# Patient Record
Sex: Male | Born: 1940 | Race: White | Hispanic: No | Marital: Married | Smoking: Never smoker
Health system: Southern US, Community
[De-identification: ages and names within clinical notes are randomized; demographics above are authoritative.]

## PROBLEM LIST (undated history)

## (undated) DIAGNOSIS — L409 Psoriasis, unspecified: Secondary | ICD-10-CM

## (undated) HISTORY — PX: SHOULDER ARTHROSCOPY: SHX128

## (undated) HISTORY — PX: LUMBAR LAMINECTOMY: SHX95

## (undated) HISTORY — PX: HIP ARTHROPLASTY: SHX981

## (undated) HISTORY — PX: ANKLE ARTHROSCOPY: SHX545

## (undated) HISTORY — PX: CATARACT EXTRACTION: SUR2

## (undated) HISTORY — DX: Psoriasis, unspecified: L40.9

---

## 2014-04-30 ENCOUNTER — Encounter: Payer: Self-pay | Admitting: Sports Medicine

## 2014-04-30 ENCOUNTER — Ambulatory Visit (INDEPENDENT_AMBULATORY_CARE_PROVIDER_SITE_OTHER): Payer: Medicare Other | Admitting: Sports Medicine

## 2014-04-30 VITALS — BP 141/75 | HR 60 | Wt 160.0 lb

## 2014-04-30 DIAGNOSIS — M1611 Unilateral primary osteoarthritis, right hip: Secondary | ICD-10-CM | POA: Insufficient documentation

## 2014-04-30 DIAGNOSIS — M1610 Unilateral primary osteoarthritis, unspecified hip: Secondary | ICD-10-CM

## 2014-04-30 DIAGNOSIS — M169 Osteoarthritis of hip, unspecified: Secondary | ICD-10-CM

## 2014-04-30 MED ORDER — TRAMADOL HCL 50 MG PO TABS: ORAL_TABLET | ORAL | Status: DC

## 2014-04-30 NOTE — Assessment & Plan Note (Signed)
Persistent despite NSAIDs. Tramadol, home rehabilitation, femoral acetabular joint injection as above. Return to see me in a month.

## 2014-04-30 NOTE — Progress Notes (Signed)
   Subjective:    I'm seeing this patient as a consultation for:  Dr. Gae Dry  CC: Right hip pain  HPI: This is a pleasant 73 year old male, for several days and increasing pain he localizes in his right groin, moderate, persistent, worse with weightbearing. No trauma, no constitutional symptoms by his PCP where x-ray showed hip osteoarthritis and he was given an NSAID. Unfortunately this has been ineffective and he has persistent pain.  Past medical history, Surgical history, Family history not pertinant except as noted below, Social history, Allergies, and medications have been entered into the medical record, reviewed, and no changes needed.   Review of Systems: No headache, visual changes, nausea, vomiting, diarrhea, constipation, dizziness, abdominal pain, skin rash, fevers, chills, night sweats, weight loss, swollen lymph nodes, body aches, joint swelling, muscle aches, chest pain, shortness of breath, mood changes, visual or auditory hallucinations.   Objective:   General: Well Developed, well nourished, and in no acute distress.  Neuro/Psych: Alert and oriented x3, extra-ocular muscles intact, able to move all 4 extremities, sensation grossly intact. Skin: Warm and dry, no rashes noted.  Respiratory: Not using accessory muscles, speaking in full sentences, trachea midline.  Cardiovascular: Pulses palpable, no extremity edema. Abdomen: Does not appear distended. Right Hip: ROM IR: 45 Deg, ER: 45 Deg, Flexion: 120 Deg, Extension: 100 Deg, Abduction: 45 Deg, Adduction: 45 Deg Strength IR: 5/5, ER: 5/5, Flexion: 5/5, Extension: 5/5, Abduction: 5/5, Adduction: 5/5 There is good internal rotation however this does reproduce pain Pelvic alignment unremarkable to inspection and palpation. Standing hip rotation and gait without trendelenburg sign / unsteadiness. Greater trochanter without tenderness to palpation. No tenderness over piriformis. No pain with FABER or FADIR. No SI joint  tenderness and normal minimal SI movement.  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  Procedure: Real-time Ultrasound Guided Injection of right hip Device: GE Logiq E  Verbal informed consent obtained.  Time-out conducted.  Noted no overlying erythema, induration, or other signs of local infection.  Skin prepped in a sterile fashion.  Local anesthesia: Topical Ethyl chloride.  With sterile technique and under real time ultrasound guidance:  Spinal needle advanced to the femoral head/neck junction, contacted bone, and a total of 2 cc Kenalog 40, 4 cc lidocaine injected easily. Completed without difficulty  Pain immediately resolved suggesting accurate placement of the medication.  Advised to call if fevers/chills, erythema, induration, drainage, or persistent bleeding.  Images permanently stored and available for review in the ultrasound unit.  Impression: Technically successful ultrasound guided injection.  Impression and Recommendations:   This case required medical decision making of moderate complexity.

## 2014-05-18 ENCOUNTER — Ambulatory Visit (INDEPENDENT_AMBULATORY_CARE_PROVIDER_SITE_OTHER): Payer: Medicare Other | Admitting: Sports Medicine

## 2014-05-18 ENCOUNTER — Ambulatory Visit (INDEPENDENT_AMBULATORY_CARE_PROVIDER_SITE_OTHER): Payer: Medicare Other

## 2014-05-18 ENCOUNTER — Encounter: Payer: Self-pay | Admitting: Sports Medicine

## 2014-05-18 VITALS — BP 131/76 | HR 67 | Ht 68.0 in | Wt 157.0 lb

## 2014-05-18 DIAGNOSIS — M5030 Other cervical disc degeneration, unspecified cervical region: Secondary | ICD-10-CM

## 2014-05-18 DIAGNOSIS — M542 Cervicalgia: Secondary | ICD-10-CM

## 2014-05-18 MED ORDER — PREDNISONE 50 MG PO TABS: ORAL_TABLET | ORAL | Status: DC

## 2014-05-18 MED ORDER — MELOXICAM 15 MG PO TABS: ORAL_TABLET | ORAL | Status: DC

## 2014-05-18 MED ORDER — KETOROLAC TROMETHAMINE 30 MG/ML IJ SOLN
30.0000 mg | Freq: Once | INTRAMUSCULAR | Status: AC
  Administered 2014-05-18: 30 mg via INTRAMUSCULAR

## 2014-05-18 MED ORDER — CYCLOBENZAPRINE HCL 10 MG PO TABS: ORAL_TABLET | ORAL | Status: DC

## 2014-05-18 NOTE — Progress Notes (Signed)
  Subjective:    CC: Neck pain  HPI: This pleasant 73 year old male comes in with a several week history of pain in his neck after painting the ceiling. It is moderate, persistent, worse with extension, no radiation no radicular signs, no lower extremity symptoms, no trauma, no constitutional symptoms.  Past medical history, Surgical history, Family history not pertinant except as noted below, Social history, Allergies, and medications have been entered into the medical record, reviewed, and no changes needed.   Review of Systems: No fevers, chills, night sweats, weight loss, chest pain, or shortness of breath.   Objective:    General: Well Developed, well nourished, and in no acute distress.  Neuro: Alert and oriented x3, extra-ocular muscles intact, sensation grossly intact.  HEENT: Normocephalic, atraumatic, pupils equal round reactive to light, neck supple, no masses, no lymphadenopathy, thyroid nonpalpable.  Skin: Warm and dry, no rashes. Cardiac: Regular rate and rhythm, no murmurs rubs or gallops, no lower extremity edema.  Respiratory: Clear to auscultation bilaterally. Not using accessory muscles, speaking in full sentences. Neck: Inspection unremarkable. No palpable stepoffs. Negative Spurling's maneuver. 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

## 2014-05-18 NOTE — Assessment & Plan Note (Signed)
Axial pain and spasm without radicular signs after painting the ceiling.  Toradol 30, Flexeril, prednisone, Mobic. Rehabilitation exercises, x-rays. Return in one month

## 2014-05-29 ENCOUNTER — Ambulatory Visit (INDEPENDENT_AMBULATORY_CARE_PROVIDER_SITE_OTHER): Payer: Medicare Other | Admitting: Sports Medicine

## 2014-05-29 ENCOUNTER — Encounter: Payer: Self-pay | Admitting: Sports Medicine

## 2014-05-29 VITALS — BP 131/72 | HR 73 | Ht 68.0 in | Wt 161.0 lb

## 2014-05-29 DIAGNOSIS — M542 Cervicalgia: Secondary | ICD-10-CM

## 2014-05-29 DIAGNOSIS — M1610 Unilateral primary osteoarthritis, unspecified hip: Secondary | ICD-10-CM

## 2014-05-29 DIAGNOSIS — M1611 Unilateral primary osteoarthritis, right hip: Secondary | ICD-10-CM

## 2014-05-29 NOTE — Assessment & Plan Note (Signed)
Completely resolved with conservative measures. 

## 2014-05-29 NOTE — Progress Notes (Signed)
  Subjective:    CC: Followup  HPI: Right hip osteoarthritis: Resolved after injection month ago.  Neck pain: Cervical degenerative disc disease on x-rays, resolved after conservative treatment. Occasional twinges of pain, but minimal and he can live with it.  Past medical history, Surgical history, Family history not pertinant except as noted below, Social history, Allergies, and medications have been entered into the medical record, reviewed, and no changes needed.   Review of Systems: No fevers, chills, night sweats, weight loss, chest pain, or shortness of breath.   Objective:    General: Well Developed, well nourished, and in no acute distress.  Neuro: Alert and oriented x3, extra-ocular muscles intact, sensation grossly intact.  HEENT: Normocephalic, atraumatic, pupils equal round reactive to light, neck supple, no masses, no lymphadenopathy, thyroid nonpalpable.  Skin: Warm and dry, no rashes. Cardiac: Regular rate and rhythm, no murmurs rubs or gallops, no lower extremity edema.  Respiratory: Clear to auscultation bilaterally. Not using accessory muscles, speaking in full sentences. Neck: Inspection unremarkable. No palpable stepoffs. Negative Spurling's maneuver. Full neck range of motion Grip strength and sensation normal in bilateral hands Strength good C4 to T1 distribution No sensory change to C4 to T1 Negative Hoffman sign bilaterally Reflexes normal  Impression and Recommendations:

## 2014-05-29 NOTE — Assessment & Plan Note (Signed)
Pain-free after injection one month ago.

## 2014-06-22 ENCOUNTER — Ambulatory Visit (INDEPENDENT_AMBULATORY_CARE_PROVIDER_SITE_OTHER): Payer: Medicare Other | Admitting: Sports Medicine

## 2014-06-22 ENCOUNTER — Encounter: Payer: Self-pay | Admitting: Sports Medicine

## 2014-06-22 VITALS — BP 142/77 | HR 72 | Ht 68.0 in | Wt 160.0 lb

## 2014-06-22 DIAGNOSIS — M1610 Unilateral primary osteoarthritis, unspecified hip: Secondary | ICD-10-CM

## 2014-06-22 DIAGNOSIS — M1611 Unilateral primary osteoarthritis, right hip: Secondary | ICD-10-CM

## 2014-06-22 MED ORDER — DEVILS CLAW ROOT POWD: Status: DC

## 2014-06-22 MED ORDER — GLUCOSAMINE-CHONDROITIN 750-600 MG PO CHEW: 2.0000 | CHEWABLE_TABLET | Freq: Two times a day (BID) | ORAL | Status: DC

## 2014-06-22 MED ORDER — CELECOXIB 200 MG PO CAPS: ORAL_CAPSULE | ORAL | Status: DC

## 2014-06-22 NOTE — Progress Notes (Signed)
  Procedure: Real-time Ultrasound Guided Injection of right femoral acetabular joint Device: GE Logiq E  Verbal informed consent obtained.  Time-out conducted.  Noted no overlying erythema, induration, or other signs of local infection.  Skin prepped in a sterile fashion.  Local anesthesia: Topical Ethyl chloride.  With sterile technique and under real time ultrasound guidance:  1 cc Depo-Medrol 40, 4 cc lidocaine injected easily into the femoral acetabular joint. Completed without difficulty  Pain immediately resolved suggesting accurate placement of the medication.  Advised to call if fevers/chills, erythema, induration, drainage, or persistent bleeding.  Images permanently stored and available for review in the ultrasound unit.  Impression: Technically successful ultrasound guided injection.

## 2014-06-22 NOTE — Assessment & Plan Note (Signed)
Repeat injection, this is only 2 months after the last injection. He is now a candidate for total hip arthroplasty. I'm going to add glucosamine, chondroitin as well as devils claw. Switching to Celebrex

## 2014-07-20 ENCOUNTER — Ambulatory Visit: Payer: Medicare Other | Admitting: Sports Medicine

## 2016-08-01 ENCOUNTER — Ambulatory Visit (INDEPENDENT_AMBULATORY_CARE_PROVIDER_SITE_OTHER): Payer: Medicare Other

## 2016-08-01 ENCOUNTER — Ambulatory Visit (INDEPENDENT_AMBULATORY_CARE_PROVIDER_SITE_OTHER): Payer: Medicare Other | Admitting: Sports Medicine

## 2016-08-01 DIAGNOSIS — M5136 Other intervertebral disc degeneration, lumbar region: Secondary | ICD-10-CM

## 2016-08-01 DIAGNOSIS — M48061 Spinal stenosis, lumbar region without neurogenic claudication: Secondary | ICD-10-CM | POA: Insufficient documentation

## 2016-08-01 DIAGNOSIS — M51369 Other intervertebral disc degeneration, lumbar region without mention of lumbar back pain or lower extremity pain: Secondary | ICD-10-CM

## 2016-08-01 MED ORDER — PREDNISONE 50 MG PO TABS: ORAL_TABLET | ORAL | 0 refills | Status: DC

## 2016-08-01 MED ORDER — CYCLOBENZAPRINE HCL 10 MG PO TABS: ORAL_TABLET | ORAL | 0 refills | Status: DC

## 2016-08-01 NOTE — Progress Notes (Signed)
  Subjective:    CC: Right hip pain  HPI: This is a pleasant 75 year old male with several weeks of pain that he localizes in the posterior aspect of his right hip with radiation to the back of the thigh, moderate, persistent, worse with sitting in a car, flexion, Valsalva without any overt radiculopathy. No bowel or bladder dysfunction, saddle numbness, constitutional symptoms, or trauma.  Past medical history, Surgical history, Family history not pertinant except as noted below, Social history, Allergies, and medications have been entered into the medical record, reviewed, and no changes needed.   Review of Systems: No fevers, chills, night sweats, weight loss, chest pain, or shortness of breath.   Objective:    General: Well Developed, well nourished, and in no acute distress.  Neuro: Alert and oriented x3, extra-ocular muscles intact, sensation grossly intact.  HEENT: Normocephalic, atraumatic, pupils equal round reactive to light, neck supple, no masses, no lymphadenopathy, thyroid nonpalpable.  Skin: Warm and dry, no rashes. Cardiac: Regular rate and rhythm, no murmurs rubs or gallops, no lower extremity edema.  Respiratory: Clear to auscultation bilaterally. Not using accessory muscles, speaking in full sentences. Right Hip: ROM IR: 60 Deg, ER: 60 Deg, Flexion: 120 Deg, Extension: 100 Deg, Abduction: 45 Deg, Adduction: 45 Deg Strength IR: 5/5, ER: 5/5, Flexion: 5/5, Extension: 5/5, Abduction: 5/5, Adduction: 5/5 Pelvic alignment unremarkable to inspection and palpation. Standing hip rotation and gait without trendelenburg / unsteadiness. Greater trochanter without tenderness to palpation. No tenderness over piriformis. No SI joint tenderness and normal minimal SI movement. Back Exam:  Inspection: Unremarkable  Motion: Flexion 45 deg, Extension 45 deg, Side Bending to 45 deg bilaterally,  Rotation to 45 deg bilaterally  SLR laying: Positive with reproduction of back pain but no  radiculopathy  XSLR laying: Negative  Palpable tenderness: None. FABER: negative. Sensory change: Gross sensation intact to all lumbar and sacral dermatomes.  Reflexes: 2+ at both patellar tendons, 2+ at achilles tendons, Babinski's downgoing.  Strength at foot  Plantar-flexion: 5/5 Dorsi-flexion: 5/5 Eversion: 5/5 Inversion: 5/5  Leg strength  Quad: 5/5 Hamstring: 5/5 Hip flexor: 5/5 Hip abductors: 5/5  Gait unremarkable.  Lumbar spine x-rays show widespread degenerative disc disease  Impression and Recommendations:    Lumbar degenerative disc disease Axial, mostly discogenic-type back pain.  Physical therapy, prednisone, Flexeril at bedtime. X-rays. Next line return to see me in one month, MRI for interventional planning if no better.

## 2016-08-01 NOTE — Assessment & Plan Note (Signed)
Axial, mostly discogenic-type back pain.  Physical therapy, prednisone, Flexeril at bedtime. X-rays. Next line return to see me in one month, MRI for interventional planning if no better.

## 2016-08-09 ENCOUNTER — Ambulatory Visit (INDEPENDENT_AMBULATORY_CARE_PROVIDER_SITE_OTHER): Payer: Medicare Other | Admitting: Physical Therapy

## 2016-08-09 ENCOUNTER — Encounter: Payer: Self-pay | Admitting: Physical Therapy

## 2016-08-09 DIAGNOSIS — M2560 Stiffness of unspecified joint, not elsewhere classified: Secondary | ICD-10-CM | POA: Diagnosis not present

## 2016-08-09 DIAGNOSIS — M5441 Lumbago with sciatica, right side: Secondary | ICD-10-CM | POA: Diagnosis not present

## 2016-08-09 DIAGNOSIS — M2569 Stiffness of other specified joint, not elsewhere classified: Secondary | ICD-10-CM

## 2016-08-09 DIAGNOSIS — M6281 Muscle weakness (generalized): Secondary | ICD-10-CM

## 2016-08-09 NOTE — Therapy (Signed)
[facility] [address] [place], [place], [address] Phone: [phone]   Fax:  [phone]  Physical Therapy Evaluation  Patient Details  Name: Randy Ortega MRN: [id number] Date of Birth: [birth year] Referring Provider: Dr Benjamin Stainhekkekandam  Encounter Date: 08/09/2016      PT End of Session - 08/09/16 0921    Visit Number 1   Number of Visits 6   Date for PT Re-Evaluation 09/20/16   PT Start Time 0846   PT Stop Time 0945   PT Time Calculation (min) 59 min   Activity Tolerance Patient limited by pain      Past Medical History:  Diagnosis Date  . Psoriasis     History reviewed. No pertinent surgical history.  There were no vitals filed for this visit.       Subjective Assessment - 08/09/16 0849    Subjective Pt reports he has a long h/o of low back pain, noticed it most with using a push mower, now using a riding one.  He would have episodes where the pain was worse, about 3 months ago he was having trouble lifting the Rt LE due to pain.  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    Pertinent History Rt shoulder surgery years ago.  Lt shoulder scope, bilat knee scopes and Rt ankle repair. 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 Has a home TENS unit   How long can you sit comfortably? sitting is fine, transitioning to stand difficult.  Has to limp a little then improves.    How long can you walk comfortably? no limits   Diagnostic tests x-rays DDD in lumbar spine.    Patient Stated Goals learn what he can do to keep getting around   Currently in Pain? Yes   Pain Score 3    Pain Location Back   Pain Orientation Right   Pain Descriptors / Indicators Dull   Pain Type Chronic pain   Pain Onset More than a month ago   Pain Frequency Constant   Aggravating Factors  prolonged sitting and overwork in the yard   Pain Relieving Factors walking,             [facility] - 08/09/16 0001      Assessment   Medical  Diagnosis Lumbar DDD   Referring Provider Dr Benjamin Stainhekkekandam   Onset Date/Surgical Date 05/09/16   Hand Dominance Right   Next MD Visit 08/31/16   Prior Therapy not for back     Precautions   Precautions None     Balance Screen   Has the patient fallen in the past 6 months No   Has the patient had a decrease in activity level because of a fear of falling?  No   Is the patient reluctant to leave their home because of a fear of falling?  No     Home Environment   Living Environment Private residence   Living Arrangements Spouse/significant other     Prior Function   Level of Independence Independent   Vocation Retired   Leisure yard work, outside [employer]ies, stays busy.      Observation/Other Assessments   Focus on Therapeutic Outcomes (FOTO)  45% limited     Functional Tests   Functional tests Squat;Single leg stance     Squat   Comments slight shift to Lt      Single Leg Stance   Comments Lt WNL, Rt has pain      Posture/Postural Control  Posture/Postural Control No significant limitations     ROM / Strength   AROM / PROM / Strength AROM;Strength     AROM   AROM Assessment Site Lumbar   Lumbar Flexion to floor with Rt side pressure   Lumbar Extension decreased 25%   Lumbar - Right Side Bend WNL with pain   Lumbar - Left Side Bend WNL   Lumbar - Right Rotation WNL   Lumbar - Left Rotation WNL     Strength   Strength Assessment Site Hip;Knee;Ankle;Lumbar   Right/Left Hip --  Lt WNL, Rt 5-/5 through out   Right/Left Knee Right  Lt WNL   Right Knee Flexion 4+/5   Right Knee Extension 4/5   Right/Left Ankle --  bilat WNL   Lumbar Flexion --  TA fair   Lumbar Extension --  multifidi good     Flexibility   Soft Tissue Assessment /Muscle Length yes   Hamstrings Lt 60, Rt 55  tight Rt SKTC   Quadriceps Lt 118, Rt 110 in prone     Palpation   Spinal mobility CPA mobs sacrum/lumbar WNL, pain and hypomobile at Rt UPA L5-3, Lt UPA WNL   Palpation comment some  tightness in lumbar paraspinals however not painful      Special Tests    Special Tests --  (-) SLR and slump bilat     Transfers   Transfers --  pain with rolling and pushing through his feet                   [facility] Adult PT Treatment/Exercise - 08/09/16 0001      Exercises   Exercises Lumbar     Lumbar Exercises: Stretches   Passive Hamstring Stretch 1 rep;30 seconds  with strap   Single Knee to Chest Stretch 1 rep;30 seconds   Lower Trunk Rotation 5 reps   Prone on Elbows Stretch 60 seconds     Modalities   Modalities Electrical Stimulation;Moist Heat     Moist Heat Therapy   Number Minutes Moist Heat 15 Minutes   Moist Heat Location Lumbar Spine     Electrical Stimulation   Electrical Stimulation Location lumbar spine Rt   Electrical Stimulation Action IFC    Electrical Stimulation Parameters to tolerance   Electrical Stimulation Goals Pain     Manual Therapy   Manual Therapy Joint mobilization   Joint Mobilization grade III+ to Rt UPA L4 with decreased pain in supine, returned upon standing and turning                 PT Education - 08/09/16 0914    Education provided Yes   Education Details HEP    Person(s) Educated Patient   Methods Explanation;Demonstration;Handout   Comprehension Verbalized understanding;Returned demonstration             PT Long Term Goals - 08/09/16 0925      PT LONG TERM GOAL #1   Title I with advanced HEP ( 09/20/16)    Time 6   Period Weeks   Status New     PT LONG TERM GOAL #2   Title perform bed mobility and transfers without Rt sided LBP ( 09/20/16)    Time 6   Period Weeks   Status New     PT LONG TERM GOAL #3   Title demo strong contraction of TA with core work ( 09/20/16)    Time 6   Period Weeks   Status New  PT LONG TERM GOAL #4   Title improve FOTO =/< 32% limited CJ level ( 09/20/16)    Time 6   Period Weeks   Status New               Plan - 08/09/16 4098     Clinical Impression Statement 75 yo very active man presents with h/o LBP that flared up about 3 months ago.  He has slight weakness in the Rt hip, knee and abdominals.  Significant tightness in bilat hips and facets joints in lower Rt lumbar are hypomobile.  He has some muscular banding in the lumbar paraspinals however they are not tender to palpation. Pain with bed mobility and tranfers.    Rehab Potential Excellent   PT Frequency 1x / week   PT Duration 6 weeks   PT Treatment/Interventions Moist Heat;Traction;Ultrasound;Therapeutic exercise;Dry needling;Taping;Manual techniques;Neuromuscular re-education;Cryotherapy;Electrical Stimulation;Patient/family education   PT Next Visit Plan body mechanics - tranfers and yard work, TA work, modalities PRN   Financial planner with Plan of Care Patient      Patient will benefit from skilled therapeutic intervention in order to improve the following deficits and impairments:  Decreased strength, Pain, Impaired flexibility, Hypomobility  Visit Diagnosis: Lumbago with sciatica, right side - Plan: PT plan of care cert/re-cert  Muscle weakness (generalized) - Plan: PT plan of care cert/re-cert  Back stiffness - Plan: PT plan of care cert/re-cert     Problem List Patient Active Problem List   Diagnosis Date Noted  . Lumbar degenerative disc disease 08/01/2016  . Neck pain 05/18/2014  . Osteoarthritis of right hip 04/30/2014    Roderic Scarce PT  08/09/2016, 9:31 AM  [facility] [address] [place], [place], [address] Phone: [phone]   Fax:  [phone]  Name: Randy Ortega MRN: [id number] Date of Birth: [birth year]

## 2016-08-09 NOTE — Patient Instructions (Signed)
On Elbows (Prone)    Rise up on elbows as high as possible, keeping hips on floor. Hold ____ seconds. Repeat ____ times per set. Do ____ sets per session. Do ____ sessions per day.  Hamstring Step 1    Straighten left knee. Keep knee level with other knee or on bolster. Hold _45__ seconds. Relax knee by returning foot to start. Repeat _3__ times. Repeat on opposite leg.  2times/day  Knee to Chest    Lying supine, bend involved knee to chest , hold 15-30 seconds, repeat 3___ times. Repeat with other leg. Do __2_ times per day.    Supine With Rotation    Lie, back flat, legs bent, feet together. Rotate knees to one side. Hold __10_ seconds. Repeat to other side. Repeat _10__ times per session. Do __2_ sessions per day.   [facility]  [address] [place], [place] [address]  [phone] (office) [phone] (fax)

## 2016-08-16 ENCOUNTER — Ambulatory Visit (INDEPENDENT_AMBULATORY_CARE_PROVIDER_SITE_OTHER): Payer: Medicare Other | Admitting: Physical Therapy

## 2016-08-16 DIAGNOSIS — M2569 Stiffness of other specified joint, not elsewhere classified: Secondary | ICD-10-CM

## 2016-08-16 DIAGNOSIS — M6281 Muscle weakness (generalized): Secondary | ICD-10-CM

## 2016-08-16 DIAGNOSIS — M5441 Lumbago with sciatica, right side: Secondary | ICD-10-CM

## 2016-08-16 DIAGNOSIS — M2560 Stiffness of unspecified joint, not elsewhere classified: Secondary | ICD-10-CM | POA: Diagnosis not present

## 2016-08-16 NOTE — Therapy (Signed)
[place], [place], [address] Phone: [phone]   Fax:  [phone]  Physical Therapy Treatment  Patient Details  Name: Randy Ortega MRN: [id number] Date of Birth: 09-21-1941 Referring Provider: Dr. Helane Rima  Encounter Date: 08/16/2016      PT End of Session - 08/16/16 0934    Visit Number 2   Number of Visits 6   Date for PT Re-Evaluation 09/20/16   PT Start Time 0930   PT Stop Time 1036   PT Time Calculation (min) 66 min      Past Medical History:  Diagnosis Date  . Psoriasis     No past surgical history on file.  There were no vitals filed for this visit.      Subjective Assessment - 08/16/16 0934    Subjective Pt reports he has been performing HEP, but his Rt side back is still painful ( up to 8-9/10) with riding in car.  "I can hardly get out of car". He has pain with weight through Rt heel.   He states he is comfortable with resting in recliner.  He is up to walking 1.25 mile each day, gives some relief.   He is using TENS unit each day after completing HEP.    Pertinent History Rt shoulder surgery years ago.  Lt shoulder scope, bilat knee scopes and Rt ankle repair. 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 Has a home TENS unit   Currently in Pain? Yes   Pain Score 3    Pain Location Back   Pain Orientation Right   Pain Descriptors / Indicators Dull;Sharp   Aggravating Factors  transitional movements after sitting.     Pain Relieving Factors walking, TENS, ice.             [facility] PT Assessment - 08/16/16 0001      Assessment   Medical Diagnosis Lumbar DDD   Referring Provider Dr. Helane Rima   Onset Date/Surgical Date 05/09/16   Hand Dominance Right   Next MD Visit 08/31/16   Prior Therapy not for back           [facility] Adult PT Treatment/Exercise - 08/16/16 0001      Self-Care   Self-Care Other Self-Care Comments   Other Self-Care Comments  Pt educated on self massage with ball to  lumbar paraspinals. Pt verbalized understanding.      Exercises   Exercises Knee/Hip;Lumbar     Lumbar Exercises: Stretches   Passive Hamstring Stretch 3 reps;30 seconds   Passive Hamstring Stretch Limitations VC for form.    Single Knee to Chest Stretch 30 seconds;2 reps   Lower Trunk Rotation 5 reps   Hip Flexor Stretch 2 reps;20 seconds  (Lt lunge with Rt arm over head)   Prone on Elbows Stretch 5 reps;20 seconds     Lumbar Exercises: Aerobic   Stationary Bike NuStep L5: 6.5 min      Lumbar Exercises: Supine   Ab Set 10 reps;5 seconds  vc to slow speed of counting   Clam 5 reps  with abdominal bracing, each leg.    Bent Knee Raise 1 second;5 reps  with abdominal bracing, (each leg)     Modalities   Modalities Electrical Stimulation;Moist Heat     Moist Heat Therapy   Number Minutes Moist Heat 15 Minutes   Moist Heat Location Lumbar Spine     Electrical Stimulation   Electrical Stimulation Location Rt QL    Electrical Stimulation Action IFC  Electrical Stimulation Parameters to tolerance    Electrical Stimulation Goals Pain     Manual Therapy   Manual Therapy Myofascial release   Myofascial Release Rt iliopsoas, Rt QL                 PT Education - 08/16/16 0949    Education provided Yes   Education Details HEP: added trans abdominal exercises and standing back extension    Person(s) Educated Patient   Methods Handout;Explanation;Demonstration;Tactile cues   Comprehension Verbalized understanding;Returned demonstration             PT Long Term Goals - 08/09/16 0925      PT LONG TERM GOAL #1   Title I with advanced HEP ( 09/20/16)    Time 6   Period Weeks   Status New     PT LONG TERM GOAL #2   Title perform bed mobility and transfers without Rt sided LBP ( 09/20/16)    Time 6   Period Weeks   Status New     PT LONG TERM GOAL #3   Title demo strong contraction of TA with core work ( 09/20/16)    Time 6   Period Weeks   Status New      PT LONG TERM GOAL #4   Title improve FOTO =/< 32% limited CJ level ( 09/20/16)    Time 6   Period Weeks   Status New               Plan - 08/16/16 1245    Clinical Impression Statement Pt tolerated most exercises well, requiring minor cues to engage core properly.  Pt had point tenderness in Rt iliopsoas and QL with manual; reduced with manual therapy.  No goals met yet; only 2nd visit.    Rehab Potential Excellent   PT Frequency 1x / week   PT Duration 6 weeks   PT Treatment/Interventions Moist Heat;Traction;Ultrasound;Therapeutic exercise;Dry needling;Taping;Manual techniques;Neuromuscular re-education;Cryotherapy;Electrical Stimulation;Patient/family education   PT Next Visit Plan manual therapy to Rt QL, psoas, glute.,    Consulted and Agree with Plan of Care Patient      Patient will benefit from skilled therapeutic intervention in order to improve the following deficits and impairments:  Decreased strength, Pain, Impaired flexibility, Hypomobility  Visit Diagnosis: Lumbago with sciatica, right side  Muscle weakness (generalized)  Back stiffness     Problem List Patient Active Problem List   Diagnosis Date Noted  . Lumbar degenerative disc disease 08/01/2016  . Neck pain 05/18/2014  . Osteoarthritis of right hip 04/30/2014   Kerin Perna, PTA 08/16/16 12:48 PM  [facility] [place], [place], [address] Phone: [phone]   Fax:  [phone]  Name: Ziv Welchel MRN: [id number] Date of Birth: 02-Mar-1941

## 2016-08-16 NOTE — Patient Instructions (Signed)
  Abdominal Bracing With Pelvic Floor (Hook-Lying)   With neutral spine, tighten pelvic floor and abdominals. Hold 5 seconds. Repeat __10_ times. Do _1__ times a day.   Knee to Chest: Transverse Plane Stability   Bring one knee up, then return. Be sure pelvis does not roll side to side. Keep pelvis still. Lift knee __10_ times each leg. Restabilize pelvis. Repeat with other leg. Do _1-2__ sets, _1__ times per day.   Hip External Rotation With Pillow: Transverse Plane Stability   One knee bent, one leg straight, on pillow. Slowly roll bent knee out. Be sure pelvis does not rotate. Do _10__ times. Restabilize pelvis. Repeat with other leg. Do _1-2__ sets, _1__ times per day.    Trunk Extension    Standing, place back of open hands on low back. Straighten spine then arch the back and move shoulders back.  Hold 2-3 seconds.  Repeat _5___ times per session.   [facility] [address] [place] [address] [place], [place] [address]  [phone] (office) [phone] (fax)

## 2016-08-23 ENCOUNTER — Encounter: Payer: Medicare Other | Admitting: Physical Therapy

## 2016-08-25 ENCOUNTER — Encounter: Payer: Self-pay | Admitting: Rehabilitative and Restorative Service Providers"

## 2016-08-25 ENCOUNTER — Ambulatory Visit (INDEPENDENT_AMBULATORY_CARE_PROVIDER_SITE_OTHER): Payer: Medicare Other | Admitting: Rehabilitative and Restorative Service Providers"

## 2016-08-25 DIAGNOSIS — M2560 Stiffness of unspecified joint, not elsewhere classified: Secondary | ICD-10-CM

## 2016-08-25 DIAGNOSIS — M2569 Stiffness of other specified joint, not elsewhere classified: Secondary | ICD-10-CM

## 2016-08-25 DIAGNOSIS — M5441 Lumbago with sciatica, right side: Secondary | ICD-10-CM

## 2016-08-25 DIAGNOSIS — M6281 Muscle weakness (generalized): Secondary | ICD-10-CM | POA: Diagnosis not present

## 2016-08-25 NOTE — Therapy (Addendum)
[place], [place], [address] Phone: [phone]   Fax:  [phone]  Physical Therapy Treatment  Patient Details  Name: Randy Ortega MRN: [id number] Date of Birth: 1941-06-20 Referring Provider: Dr. Helane Rima  Encounter Date: 08/25/2016      PT End of Session - 08/25/16 1413    Visit Number 3   Number of Visits 6   Date for PT Re-Evaluation 09/20/16   PT Start Time 1400   PT Stop Time 1456   PT Time Calculation (min) 56 min   Equipment Utilized During Treatment Gait belt   Activity Tolerance Patient tolerated treatment well      Past Medical History:  Diagnosis Date  . Psoriasis     History reviewed. No pertinent surgical history.  There were no vitals filed for this visit.      Subjective Assessment - 08/25/16 1414    Subjective Patient reports that he had some increased pain in the Rt hip and LB yesterday when working on a cat scratch post in his basement. Using TENS unit at home. Sits in recliner when relaxing at home.  Sees MD Tuesday. Will see what he says.    Currently in Pain? Yes   Pain Score 3    Pain Location Back   Pain Orientation Right   Pain Descriptors / Indicators Dull;Sharp   Pain Type Chronic pain   Pain Onset More than a month ago   Pain Frequency Constant                         [facility] Adult PT Treatment/Exercise - 08/25/16 0001      Lumbar Exercises: Stretches   Passive Hamstring Stretch 3 reps;30 seconds   Single Knee to Chest Stretch 30 seconds;2 reps   Hip Flexor Stretch 2 reps;20 seconds  Rt LE off the edge of the table    Press Ups --  2-3 sec x 10    ITB Stretch 3 reps;30 seconds     Lumbar Exercises: Aerobic   Stationary Bike NuStep L5: 6 min      Lumbar Exercises: Supine   Ab Set --  three part core 10 sec x 10    Other Supine Lumbar Exercises alt arm/leg with core x 15 each LE      Moist Heat Therapy   Number Minutes  Moist Heat 20 Minutes   Moist Heat Location Lumbar Spine     Electrical Stimulation   Electrical Stimulation Location Rt QL bilat lumbar L4/5    Electrical Stimulation Action IFC   Electrical Stimulation Parameters  to tolerance   Electrical Stimulation Goals Pain     Manual Therapy   Joint Mobilization grade III+ to Rt UPA L4 CPA mobs L3/4/5    Soft tissue mobilization deep tissue work through the Rt lumbar paraspinals and QL/lats into the piriformis musculature   Myofascial Release Rt lumbar paraspinals; QL; lats; piriformis                 PT Education - 08/25/16 1437    Education provided Yes   Education Details HEP TDN   Person(s) Educated Patient   Methods Explanation;Demonstration;Tactile cues;Verbal cues;Handout   Comprehension Verbalized understanding;Returned demonstration;Verbal cues required;Tactile cues required             PT Long Term Goals - 08/25/16 1419      PT LONG TERM GOAL #1   Title I with advanced HEP (  09/20/16)    Time 6   Period Weeks   Status On-going     PT LONG TERM GOAL #2   Title perform bed mobility and transfers without Rt sided LBP ( 09/20/16)    Time 6   Period Weeks   Status On-going     PT LONG TERM GOAL #3   Title demo strong contraction of TA with core work ( 09/20/16)    Time 6   Period Weeks   Status On-going     PT LONG TERM GOAL #4   Title improve FOTO =/< 32% limited CJ level ( 09/20/16)    Time 6   Period Weeks   Status On-going               Plan - 08/25/16 1420    Clinical Impression Statement Continued pain and muscular tightness. Worked on exercises adding stretching and stabilization exercises with min to no difficluty. Continued muscular tightness noted through Rt hip/QL/lats.  MD appt Tuesday.    Rehab Potential Good   PT Frequency 1x / week   PT Duration 6 weeks   PT Treatment/Interventions Moist Heat;Traction;Ultrasound;Therapeutic exercise;Dry needling;Taping;Manual  techniques;Neuromuscular re-education;Cryotherapy;Electrical Stimulation;Patient/family education   PT Next Visit Plan manual therapy to Rt QL, psoas, glute.,     Consulted and Agree with Plan of Care Patient      Patient will benefit from skilled therapeutic intervention in order to improve the following deficits and impairments:  Decreased strength, Pain, Impaired flexibility, Hypomobility  Visit Diagnosis: Lumbago with sciatica, right side  Muscle weakness (generalized)  Back stiffness     Problem List Patient Active Problem List   Diagnosis Date Noted  . Lumbar degenerative disc disease 08/01/2016  . Neck pain 05/18/2014  . Osteoarthritis of right hip 04/30/2014    Skilynn Durney Nilda Simmer PT, MPH  08/25/2016, 4:23 PM  [facility] [address] [place], [place], [address] Phone: [phone]   Fax:  [phone]  Name: Srihan Brutus MRN: [id number] Date of Birth: [birth year]  PHYSICAL THERAPY DISCHARGE SUMMARY  Visits from Start of Care: 3  Current functional level related to goals / functional outcomes: unknown   Remaining deficits: Unknown, per MD notes he is getting epidural injections   Education / Equipment: HEP Plan:                                                    Patient goals were not met. Patient is being discharged due to not returning since the last visit.  ?????    Jeral Pinch, PT 10/30/16 2:51 PM

## 2016-08-25 NOTE — Patient Instructions (Addendum)
Outer Hip Stretch: Reclined IT Band Stretch (Strap)   Strap around one foot, pull leg across body until you feel a pull or stretch, with shoulders on mat. Hold for 30 seconds. Repeat 3 times each leg. 2-3 times/day.  Piriformis Stretch   Lying on back, pull right knee toward opposite shoulder. Hold 30 seconds. Repeat 3 times. Do 2-3 sessions per day.   Quads / HF, Supine   Lie near edge of bed, pull both knees up toward chest. Hold one knee as you drop the other leg off the edge of the bed.  Relax hanging knee/can bend knee back if indicated. Hold 30 seconds. Repeat 3 times per session. Do 2-3 sessions per day.       Combination (Hook-Lying)    Tighten core and slowly raise left leg and lower opposite arm over head. Keep trunk rigid. Repeat _10___ times per set. Do _1-2___ sets per session. Do __1__ sessions per day.   Trigger Point Dry Needling  . What is Trigger Point Dry Needling (DN)? o DN is a physical therapy technique used to treat muscle pain and dysfunction. Specifically, DN helps deactivate muscle trigger points (muscle knots).  o A thin filiform needle is used to penetrate the skin and stimulate the underlying trigger point. The goal is for a local twitch response (LTR) to occur and for the trigger point to relax. No medication of any kind is injected during the procedure.   . What Does Trigger Point Dry Needling Feel Like?  o The procedure feels different for each individual patient. Some patients report that they do not actually feel the needle enter the skin and overall the process is not painful. Very mild bleeding may occur. However, many patients feel a deep cramping in the muscle in which the needle was inserted. This is the local twitch response.   Marland Kitchen. How Will I feel after the treatment? o Soreness is normal, and the onset of soreness may not occur for a few hours. Typically this soreness does not last longer than two days.  o Bruising is uncommon,  however; ice can be used to decrease any possible bruising.  o In rare cases feeling tired or nauseous after the treatment is normal. In addition, your symptoms may get worse before they get better, this period will typically not last longer than 24 hours.   . What Can I do After My Treatment? o Increase your hydration by drinking more water for the next 24 hours. o You may place ice or heat on the areas treated that have become sore, however, do not use heat on inflamed or bruised areas. Heat often brings more relief post needling. o You can continue your regular activities, but vigorous activity is not recommended initially after the treatment for 24 hours. o DN is best combined with other physical therapy such as strengthening, stretching, and other therapies.

## 2016-08-28 ENCOUNTER — Encounter: Payer: Self-pay | Admitting: Rehabilitative and Restorative Service Providers"

## 2016-08-29 ENCOUNTER — Ambulatory Visit (INDEPENDENT_AMBULATORY_CARE_PROVIDER_SITE_OTHER): Payer: Medicare Other | Admitting: Sports Medicine

## 2016-08-29 ENCOUNTER — Encounter: Payer: Self-pay | Admitting: Sports Medicine

## 2016-08-29 DIAGNOSIS — M51369 Other intervertebral disc degeneration, lumbar region without mention of lumbar back pain or lower extremity pain: Secondary | ICD-10-CM

## 2016-08-29 DIAGNOSIS — M5136 Other intervertebral disc degeneration, lumbar region: Secondary | ICD-10-CM | POA: Diagnosis not present

## 2016-08-29 NOTE — Assessment & Plan Note (Signed)
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 Has failed physical therapy, steroids, muscle relaxers.  We are going to proceed with an MRI for interventional planning, return to see me to go over MRI results.

## 2016-08-29 NOTE — Progress Notes (Signed)
  Subjective:    CC: Follow-up  HPI: Low back pain: Axial and discogenic with occasional radicular symptoms down to the medial right foot. Moderate, persistent, no improvement after physical therapy, steroids, slight improvement with TENS unit.  Past medical history:  Negative.  See flowsheet/record as well for more information.  Surgical history: Negative.  See flowsheet/record as well for more information.  Family history: Negative.  See flowsheet/record as well for more information.  Social history: Negative.  See flowsheet/record as well for more information.  Allergies, and medications have been entered into the medical record, reviewed, and no changes needed.   Review of Systems: No fevers, chills, night sweats, weight loss, chest pain, or shortness of breath.   Objective:    General: Well Developed, well nourished, and in no acute distress.  Neuro: Alert and oriented x3, extra-ocular muscles intact, sensation grossly intact.  HEENT: Normocephalic, atraumatic, pupils equal round reactive to light, neck supple, no masses, no lymphadenopathy, thyroid nonpalpable.  Skin: Warm and dry, no rashes. Cardiac: Regular rate and rhythm, no murmurs rubs or gallops, no lower extremity edema.  Respiratory: Clear to auscultation bilaterally. Not using accessory muscles, speaking in full sentences.  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 Has failed physical therapy, steroids, muscle relaxers.  We are going to proceed with an MRI for interventional planning, return to see me to go over MRI results.  I spent 25 minutes with this patient, greater than 50% was face-to-face time counseling regarding the above diagnoses

## 2016-08-31 ENCOUNTER — Ambulatory Visit (INDEPENDENT_AMBULATORY_CARE_PROVIDER_SITE_OTHER): Payer: Medicare Other

## 2016-08-31 DIAGNOSIS — M4806 Spinal stenosis, lumbar region: Secondary | ICD-10-CM | POA: Diagnosis not present

## 2016-08-31 DIAGNOSIS — M5136 Other intervertebral disc degeneration, lumbar region: Secondary | ICD-10-CM

## 2016-09-04 ENCOUNTER — Encounter: Payer: Self-pay | Admitting: Sports Medicine

## 2016-09-04 ENCOUNTER — Ambulatory Visit (INDEPENDENT_AMBULATORY_CARE_PROVIDER_SITE_OTHER): Payer: Medicare Other | Admitting: Sports Medicine

## 2016-09-04 DIAGNOSIS — M51369 Other intervertebral disc degeneration, lumbar region without mention of lumbar back pain or lower extremity pain: Secondary | ICD-10-CM

## 2016-09-04 DIAGNOSIS — M5136 Other intervertebral disc degeneration, lumbar region: Secondary | ICD-10-CM

## 2016-09-04 NOTE — Progress Notes (Signed)
  Subjective:    CC: MRI results  HPI:  This is a pleasant 75 year old male with axial low back pain and right-sided lumbar radiculopathy to the anterior knee and medial lower leg. He failed physical therapy, steroids, NSAIDs, muscle relaxers so he proceeded with MRI, the results of which will be dictated below, pain continues to be moderate, persistent, no bowel or bladder dysfunction, saddle numbness, no constitutional symptoms.  Past medical history:  Negative.  See flowsheet/record as well for more information.  Surgical history: Negative.  See flowsheet/record as well for more information.  Family history: Negative.  See flowsheet/record as well for more information.  Social history: Negative.  See flowsheet/record as well for more information.  Allergies, and medications have been entered into the medical record, reviewed, and no changes needed.   Review of Systems: No fevers, chills, night sweats, weight loss, chest pain, or shortness of breath.   Objective:    General: Well Developed, well nourished, and in no acute distress.  Neuro: Alert and oriented x3, extra-ocular muscles intact, sensation grossly intact.  HEENT: Normocephalic, atraumatic, pupils equal round reactive to light, neck supple, no masses, no lymphadenopathy, thyroid nonpalpable.  Skin: Warm and dry, no rashes. Cardiac: Regular rate and rhythm, no murmurs rubs or gallops, no lower extremity edema.  Respiratory: Clear to auscultation bilaterally. Not using accessory muscles, speaking in full sentences.  MRI personally reviewed and shows multilevel degenerative disc disease with central canal stenosis that is severe at the L3-L4 level and moderately severe the L4-L5 level.  Impression and Recommendations:    Lumbar degenerative disc disease Multilevel degenerative disc disease worse at the L4-L5 and L3-L4 levels with severe spinal stenosis at L3-L4. We are going to proceed with a right-sided L4-L5 interlaminar  epidural. Return to see me 4 weeks after the injection, he does have such severe spinal stenosis at the L3-L4 level I do anticipate a decompressive procedure at some point in the near future.

## 2016-09-04 NOTE — Assessment & Plan Note (Signed)
Multilevel degenerative disc disease worse at the L4-L5 and L3-L4 levels with severe spinal stenosis at L3-L4. We are going to proceed with a right-sided L4-L5 interlaminar epidural. Return to see me 4 weeks after the injection, he does have such severe spinal stenosis at the L3-L4 level I do anticipate a decompressive procedure at some point in the near future.

## 2016-09-18 ENCOUNTER — Ambulatory Visit
Admission: RE | Admit: 2016-09-18 | Discharge: 2016-09-18 | Disposition: A | Discharge status: Home / Self Care | Payer: Medicare Other | Source: Ambulatory Visit | Attending: Sports Medicine | Admitting: Sports Medicine

## 2016-09-18 VITALS — BP 159/72 | HR 66

## 2016-09-18 DIAGNOSIS — M5136 Other intervertebral disc degeneration, lumbar region: Secondary | ICD-10-CM

## 2016-09-18 MED ORDER — METHYLPREDNISOLONE ACETATE 40 MG/ML INJ SUSP (RADIOLOG
120.0000 mg | Freq: Once | INTRAMUSCULAR | Status: AC
  Administered 2016-09-18: 120 mg via EPIDURAL

## 2016-09-18 MED ORDER — IOPAMIDOL (ISOVUE-M 200) INJECTION 41%
1.0000 mL | Freq: Once | INTRAMUSCULAR | Status: AC
  Administered 2016-09-18: 1 mL via EPIDURAL

## 2016-09-18 NOTE — Discharge Instructions (Signed)

## 2016-10-19 ENCOUNTER — Encounter: Payer: Self-pay | Admitting: Sports Medicine

## 2016-10-19 ENCOUNTER — Ambulatory Visit (INDEPENDENT_AMBULATORY_CARE_PROVIDER_SITE_OTHER): Payer: Medicare Other | Admitting: Sports Medicine

## 2016-10-19 DIAGNOSIS — M5136 Other intervertebral disc degeneration, lumbar region: Secondary | ICD-10-CM

## 2016-10-19 DIAGNOSIS — M51369 Other intervertebral disc degeneration, lumbar region without mention of lumbar back pain or lower extremity pain: Secondary | ICD-10-CM

## 2016-10-19 MED ORDER — MELOXICAM 15 MG PO TABS: ORAL_TABLET | ORAL | 3 refills | Status: DC

## 2016-10-19 NOTE — Assessment & Plan Note (Signed)
Multilevel degenerative disc disease worse and L4-5 and L3-L4 with severe spinal stenosis at L3-L4. Right sided L4-L5 interlaminar epidural provided some relief, for one week. We are going to proceed with epidural #2, this time on the right at the L3-L4 level. Adding meloxicam. I do anticipate decompressive procedure due to his severe spinal stenosis, multifactorial. Return in one month after injection to evaluate response.

## 2016-10-19 NOTE — Progress Notes (Signed)
  Subjective:    CC: Follow-up  HPI: This is a pleasant 75 year old male, he has severe lumbar degenerative disc disease with spinal stenosis at multiple levels, an initial epidural provided good relief for one week, and he had a recurrence of pain, he is agreeable to proceed with epidural #2, pain is axial, mostly discogenic on the right, with radiation to the anterior thigh but nothing overtly radicular to the foot  Past medical history:  Negative.  See flowsheet/record as well for more information.  Surgical history: Negative.  See flowsheet/record as well for more information.  Family history: Negative.  See flowsheet/record as well for more information.  Social history: Negative.  See flowsheet/record as well for more information.  Allergies, and medications have been entered into the medical record, reviewed, and no changes needed.   Review of Systems: No fevers, chills, night sweats, weight loss, chest pain, or shortness of breath.   Objective:    General: Well Developed, well nourished, and in no acute distress.  Neuro: Alert and oriented x3, extra-ocular muscles intact, sensation grossly intact.  HEENT: Normocephalic, atraumatic, pupils equal round reactive to light, neck supple, no masses, no lymphadenopathy, thyroid nonpalpable.  Skin: Warm and dry, no rashes. Cardiac: Regular rate and rhythm, no murmurs rubs or gallops, no lower extremity edema.  Respiratory: Clear to auscultation bilaterally. Not using accessory muscles, speaking in full sentences.  Impression and Recommendations:    Lumbar degenerative disc disease Multilevel degenerative disc disease worse and L4-5 and L3-L4 with severe spinal stenosis at L3-L4. Right sided L4-L5 interlaminar epidural provided some relief, for one week. We are going to proceed with epidural #2, this time on the right at the L3-L4 level. Adding meloxicam. I do anticipate decompressive procedure due to his severe spinal stenosis,  multifactorial. Return in one month after injection to evaluate response.  I spent 25 minutes with this patient, greater than 50% was face-to-face time counseling regarding the above diagnoses

## 2016-11-06 ENCOUNTER — Other Ambulatory Visit: Payer: Medicare Other

## 2016-11-08 ENCOUNTER — Ambulatory Visit
Admission: RE | Admit: 2016-11-08 | Discharge: 2016-11-08 | Disposition: A | Discharge status: Home / Self Care | Payer: Medicare Other | Source: Ambulatory Visit | Attending: Sports Medicine | Admitting: Sports Medicine

## 2016-11-08 MED ORDER — IOPAMIDOL (ISOVUE-M 200) INJECTION 41%
1.0000 mL | Freq: Once | INTRAMUSCULAR | Status: AC
  Administered 2016-11-08: 1 mL via EPIDURAL

## 2016-11-08 MED ORDER — METHYLPREDNISOLONE ACETATE 40 MG/ML INJ SUSP (RADIOLOG
120.0000 mg | Freq: Once | INTRAMUSCULAR | Status: AC
  Administered 2016-11-08: 120 mg via EPIDURAL

## 2016-11-08 NOTE — Discharge Instructions (Signed)

## 2016-12-12 ENCOUNTER — Ambulatory Visit (INDEPENDENT_AMBULATORY_CARE_PROVIDER_SITE_OTHER): Payer: Medicare Other | Admitting: Sports Medicine

## 2016-12-12 DIAGNOSIS — M5136 Other intervertebral disc degeneration, lumbar region: Secondary | ICD-10-CM

## 2016-12-12 DIAGNOSIS — M51369 Other intervertebral disc degeneration, lumbar region without mention of lumbar back pain or lower extremity pain: Secondary | ICD-10-CM

## 2016-12-12 NOTE — Assessment & Plan Note (Signed)
Randy BerkshireJohnny has done well with 2 epidurals, continues to have some axial discogenic-type pain. Initial epidurals were done on the right, interlaminar at the L4-L5 level that provided about 1 week of relief, we then proceeded with a L3-L4 interlaminar epidural that provided better relief. I am going to again proceed with an L3-L4 epidural, this time transforaminal. Considering his severe spinal stenosis at the L3-L4 level I do anticipate a decompressive type procedure. I would like him to touch base with Dr. Yetta BarreJones [facility] later today.

## 2016-12-12 NOTE — Progress Notes (Signed)
  Subjective:    CC: Follow-up  HPI: Lumbar spinal stenosis: Overall doing well, he's had a couple of epidurals, both of provided good but temporary relief, continues to have some axial and discogenic-type back pain when driving long distances. No bowel or bladder dysfunction, saddle numbness, constitutional symptoms.  Past medical history:  Negative.  See flowsheet/record as well for more information.  Surgical history: Negative.  See flowsheet/record as well for more information.  Family history: Negative.  See flowsheet/record as well for more information.  Social history: Negative.  See flowsheet/record as well for more information.  Allergies, and medications have been entered into the medical record, reviewed, and no changes needed.   Review of Systems: No fevers, chills, night sweats, weight loss, chest pain, or shortness of breath.   Objective:    General: Well Developed, well nourished, and in no acute distress.  Neuro: Alert and oriented x3, extra-ocular muscles intact, sensation grossly intact.  HEENT: Normocephalic, atraumatic, pupils equal round reactive to light, neck supple, no masses, no lymphadenopathy, thyroid nonpalpable.  Skin: Warm and dry, no rashes. Cardiac: Regular rate and rhythm, no murmurs rubs or gallops, no lower extremity edema.  Respiratory: Clear to auscultation bilaterally. Not using accessory muscles, speaking in full sentences.  I again reviewed his MRI, he does have some central canal stenosis at the L4-L5 level it is moderate but severe stenosis at the L3-L4 level. He's got lesser findings in the disks and the facets at other levels.  Impression and Recommendations:    Lumbar degenerative disc disease Randy Ortega has done well with 2 epidurals, continues to have some axial discogenic-type pain. Initial epidurals were done on the right, interlaminar at the L4-L5 level that provided about 1 week of relief, we then proceeded with a L3-L4 interlaminar epidural  that provided better relief. I am going to again proceed with an L3-L4 epidural, this time transforaminal. Considering his severe spinal stenosis at the L3-L4 level I do anticipate a decompressive type procedure. I would like him to touch base with Dr. Yetta BarreJones with [facility] later today.

## 2017-01-04 HISTORY — PX: BACK SURGERY: SHX140

## 2017-01-09 ENCOUNTER — Ambulatory Visit: Payer: Medicare Other | Admitting: Sports Medicine

## 2017-10-09 ENCOUNTER — Ambulatory Visit (INDEPENDENT_AMBULATORY_CARE_PROVIDER_SITE_OTHER): Payer: Medicare Other

## 2017-10-09 ENCOUNTER — Ambulatory Visit: Payer: Medicare Other | Admitting: Sports Medicine

## 2017-10-09 ENCOUNTER — Encounter: Payer: Self-pay | Admitting: Sports Medicine

## 2017-10-09 DIAGNOSIS — M5136 Other intervertebral disc degeneration, lumbar region: Secondary | ICD-10-CM

## 2017-10-09 DIAGNOSIS — M51369 Other intervertebral disc degeneration, lumbar region without mention of lumbar back pain or lower extremity pain: Secondary | ICD-10-CM

## 2017-10-09 DIAGNOSIS — M1711 Unilateral primary osteoarthritis, right knee: Secondary | ICD-10-CM

## 2017-10-09 MED ORDER — PREDNISONE 50 MG PO TABS: ORAL_TABLET | ORAL | 0 refills | Status: DC

## 2017-10-09 NOTE — Progress Notes (Signed)
Subjective:    I'm seeing this patient as a consultation for: Randy Ortega  CC: Hip and back pain  HPI: This is a pleasant 76 year old male, he has severe L3-L4 spinal stenosis, we treated him conservatively, he did not respond and ended up with a L3-L4 decompressive type procedure, I do not have the specifics of the surgery.  Initially he did well but now is having a recurrence of right-sided low back pain with radiation over the anterior to the medial right thigh with paresthesias but not really past the knee.  In addition he is having pain at the medial joint line of his right knee, moderate, persistent without radiation, no mechanical symptoms, no trauma.  Oral analgesics and anti-inflammatories have not been fully effective.  Past medical history, Surgical history, Family history not pertinant except as noted below, Social history, Allergies, and medications have been entered into the medical record, reviewed, and no changes needed.   Review of Systems: No headache, visual changes, nausea, vomiting, diarrhea, constipation, dizziness, abdominal pain, skin rash, fevers, chills, night sweats, weight loss, swollen lymph nodes, body aches, joint swelling, muscle aches, chest pain, shortness of breath, mood changes, visual or auditory hallucinations.   Objective:   General: Well Developed, well nourished, and in no acute distress.  Neuro:  Extra-ocular muscles intact, able to move all 4 extremities, sensation grossly intact.  Deep tendon reflexes tested were normal. Psych: Alert and oriented, mood congruent with affect. ENT:  Ears and nose appear unremarkable.  Hearing grossly normal. Neck: Unremarkable overall appearance, trachea midline.  No visible thyroid enlargement. Eyes: Conjunctivae and lids appear unremarkable.  Pupils equal and round. Skin: Warm and dry, no rashes noted.  Cardiovascular: Pulses palpable, no extremity edema. Right knee: Normal to inspection with no erythema or  effusion or obvious bony abnormalities. Palpation normal with no warmth or joint line tenderness or patellar tenderness or condyle tenderness.  Tenderness palpation of the medial joint line normal in flexion and extension and lower leg rotation. Ligaments with solid consistent endpoints including ACL, PCL, LCL, MCL. Negative Mcmurray's and provocative meniscal tests. Non painful patellar compression. Patellar and quadriceps tendons unremarkable. Hamstring and quadriceps strength is normal.  Procedure: Real-time Ultrasound Guided Injection of right knee Device: GE Logiq E  Verbal informed consent obtained.  Time-out conducted.  Noted no overlying erythema, induration, or other signs of local infection.  Skin prepped in a sterile fashion.  Local anesthesia: Topical Ethyl chloride.  With sterile technique and under real time ultrasound guidance: 1 cc kenalog 40, 2 cc lidocaine, 2 cc bupivacaine injected easily Completed without difficulty  Pain immediately resolved suggesting accurate placement of the medication.  Advised to call if fevers/chills, erythema, induration, drainage, or persistent bleeding.  Images permanently stored and available for review in the ultrasound unit.  Impression: Technically successful ultrasound guided injection.  Impression and Recommendations:   This case required medical decision making of moderate complexity.  Primary osteoarthritis of right knee Injection as above, x-rays. Return in 1 month.  Lumbar degenerative disc disease Bernal did have severe spinal stenosis at L3-L4, it sounds as though he had a decompressive procedure by Dr. Yetta BarreJones, I do not have any records. He is having some increasing pain in the right side of the low back with paresthesias over the anterior medial right thigh. Adding prednisone, therapy. Repeat x-rays. Return in 1 month, repeat MRI with IV contrast if no better.  ___________________________________________ Ihor Austinhomas J.  Benjamin Stainhekkekandam, M.D., ABFM., CAQSM. Primary Care and Sports Medicine  [place] Instructor of [place] [employer] [facility] of Medicine

## 2017-10-09 NOTE — Assessment & Plan Note (Signed)
Injection as above, x-rays. Return in 1 month.

## 2017-10-09 NOTE — Assessment & Plan Note (Signed)
Randy Ortega did have severe spinal stenosis at L3-L4, it sounds as though he had a decompressive procedure by Dr. Yetta BarreJones, I do not have any records. He is having some increasing pain in the right side of the low back with paresthesias over the anterior medial right thigh. Adding prednisone, therapy. Repeat x-rays. Return in 1 month, repeat MRI with IV contrast if no better.

## 2017-10-12 ENCOUNTER — Encounter: Payer: Self-pay | Admitting: Sports Medicine

## 2017-10-22 ENCOUNTER — Encounter: Payer: Self-pay | Admitting: Physical Therapy

## 2017-10-22 ENCOUNTER — Ambulatory Visit: Payer: Medicare Other | Admitting: Physical Therapy

## 2017-10-22 DIAGNOSIS — M545 Low back pain: Secondary | ICD-10-CM | POA: Diagnosis not present

## 2017-10-22 DIAGNOSIS — G8929 Other chronic pain: Secondary | ICD-10-CM | POA: Diagnosis not present

## 2017-10-22 DIAGNOSIS — M5441 Lumbago with sciatica, right side: Principal | ICD-10-CM

## 2017-10-22 DIAGNOSIS — M62830 Muscle spasm of back: Secondary | ICD-10-CM

## 2017-10-22 DIAGNOSIS — M6281 Muscle weakness (generalized): Secondary | ICD-10-CM

## 2017-10-22 NOTE — Therapy (Signed)
[facility] [address] [place], [place], [address] Phone: [phone]   Fax:  [phone]  Physical Therapy Evaluation  Patient Details  Name: Randy Ortega MRN: [id number] Date of Birth: June 10, 1941 Referring Provider: Dr Benjamin Stainhekkekandam   Encounter Date: 10/22/2017  PT End of Session - 10/22/17 1016    Visit Number  1    Number of Visits  4    Date for PT Re-Evaluation  11/19/17    PT Start Time  1016    PT Stop Time  1111    PT Time Calculation (min)  55 min    Activity Tolerance  Patient tolerated treatment well       Past Medical History:  Diagnosis Date  . Psoriasis     History reviewed. No pertinent surgical history.  There were no vitals filed for this visit.   Subjective Assessment - 10/22/17 1015    Subjective  Pt reports he has a h/o back issues, he had surgery 10 months ago, he had a little bit of HHPT.  Currently the pain is flet in his Rt low back and into the Rt thigh,  he has tingling into the thigh and it takes a while for him to walk.     Pertinent History  Rt hip and Knee OA, lumbar surgery L2-4 01/2017    How long can you sit comfortably?  25-30 min, more pain when he relaxes.     How long can you walk comfortably?  no limitations    Currently in Pain?  Yes    Pain Score  6     Pain Location  Back    Pain Orientation  Right    Pain Descriptors / Indicators  Sharp    Pain Type  Chronic pain    Pain Radiating Towards  buttocks    Pain Onset  More than a month ago    Pain Frequency  Constant 2-8/10 pain    Aggravating Factors   sitting,     Pain Relieving Factors  proping Rt LE up when sleeping          [facility] - 10/22/17 0001      Assessment   Medical Diagnosis  Lumbar DDD    Referring Provider  Dr Benjamin Stainhekkekandam    Onset Date/Surgical Date  01/05/17    Hand Dominance  Right    Next MD Visit  11/06/17    Prior Therapy  before his surgery in2017      Precautions   Precautions  None      Balance Screen   Has the patient fallen in the past 6 months  No      Home Environment   Living Environment  Private residence    Living Arrangements  Spouse/significant other      Prior Function   Level of Independence  Independent    Vocation  Retired    Leisure  work around [employer] house and yard      Observation/Other Assessments   Focus on Therapeutic Outcomes (FOTO)   48% limited      Functional Tests   Functional tests  Squat      Squat   Comments  WNL      Posture/Postural Control   Posture/Postural Control  Postural limitations    Postural Limitations  -- upper body shift to Rt       ROM / Strength   AROM / PROM / Strength  AROM;Strength  AROM   AROM Assessment Site  Lumbar    Lumbar Flexion  WNL, to floor, with stretch pain in Rt low back    Lumbar Extension  WNL, nagging pain in Rt low back    Lumbar - Right Side Bend  WNL, pain    Lumbar - Left Side Bend  WNL, pain     Lumbar - Right Rotation  WNL    Lumbar - Left Rotation  WNL      Strength   Strength Assessment Site  Hip;Knee;Ankle;Lumbar    Right/Left Hip  -- Lt WNL, Rt abduct 4+/5, ext & flex  5-/5    Right/Left Knee  -- Lt WNL, Rt grossly 5-/5    Right/Left Ankle  -- Lt WNL, Rt grossly 5-/5    Lumbar Flexion  -- WNL    Lumbar Extension  -- multifidi Good      Flexibility   Soft Tissue Assessment /Muscle Length  -- some tightness in bilat hips      Palpation   Spinal mobility  hypomobile in lumbar spine    Palpation comment  tender and tight in Rt QL, lumbar paraspinals.              Objective measurements completed on examination: See above findings.      [facility] Adult PT Treatment/Exercise - 10/22/17 0001      Exercises   Exercises  Lumbar      Lumbar Exercises: Stretches   Lower Trunk Rotation  2 reps;30 seconds    ITB Stretch  2 reps;30 seconds cross body with strap      Modalities   Modalities  Electrical Stimulation;Moist Heat      Moist Heat  Therapy   Number Minutes Moist Heat  20 Minutes    Moist Heat Location  Lumbar Spine      Electrical Stimulation   Electrical Stimulation Location  Rt low back    Electrical Stimulation Action  IFC    Electrical Stimulation Parameters   to tolerance    Electrical Stimulation Goals  Tone;Pain      Manual Therapy   Manual Therapy  Soft tissue mobilization    Soft tissue mobilization  STM to Rt low back , multifidi, longisimuss and QL       Trigger Point Dry Needling - 10/22/17 1054    Consent Given?  Yes    Education Handout Provided  Yes    Muscles Treated Upper Body  Longissimus;Quadratus Lumborum    Longissimus Response  Palpable increased muscle length;Twitch response elicited Rt           PT Education - 10/22/17 1045    Education provided  Yes    Education Details  HEP & DN    Person(s) Educated  Patient    Methods  Explanation;Demonstration;Handout    Comprehension  Returned demonstration;Verbalized understanding          PT Long Term Goals - 10/22/17 1129      PT LONG TERM GOAL #1   Title  I with advanced HEP ( 1217/18)     Time  4    Period  Weeks    Status  New      PT LONG TERM GOAL #2   Title  report =/> 75% decrease pain in the Rt low back ( 11/19/17)     Time  4    Period  Weeks    Status  New      PT LONG TERM GOAL #3  Title  demo lumbar ROM without pain ( 11/19/17)     Time  4    Period  Weeks    Status  New      PT LONG TERM GOAL #4   Title  improve FOTO =/< 39% limited CJ level ( 11/19/17)     Time  4    Period  Weeks    Status  New             Plan - 10/22/17 1056    Clinical Impression Statement  76 yo male ~ 9 months s/p low back surgery.  He has continued pain in the Rt low back that radiates down into the Rt thigh and knee.  It is worse when he is sedentary.  Overall his strength is good however Rt LE is slightly weaker than Lt, he is hypomobile in the lumbar spine and has tightness and tenderness in the Rt lumbar  paraspinals, QL and gluts.      History and Personal Factors relevant to plan of care:  Rt hip and Knee OA, spinal surgery 01/2017 sounds like it was for discs.     Clinical Presentation  Stable    Clinical Decision Making  Low    Rehab Potential  Excellent    PT Frequency  1x / week    PT Duration  4 weeks    PT Treatment/Interventions  Dry needling;Manual techniques;Patient/family education;Therapeutic activities;Ultrasound;Cryotherapy;Electrical Stimulation;Therapeutic exercise;Passive range of motion    PT Next Visit Plan  assess response to DN and manual work, continue with this     Consulted and Agree with Plan of Care  Patient       Patient will benefit from skilled therapeutic intervention in order to improve the following deficits and impairments:  Hypomobility, Decreased strength, Increased muscle spasms  Visit Diagnosis: Chronic right-sided low back pain with right-sided sciatica - Plan: PT plan of care cert/re-cert  Muscle spasm of back - Plan: PT plan of care cert/re-cert  Muscle weakness (generalized) - Plan: PT plan of care cert/re-cert  [facility] PT PB G-CODES - 10/22/17 1131    Functional Assessment Tool Used   FOTO and professional judgement    Functional Limitations  Changing and maintaining body position    Changing and Maintaining Body Position Current Status [phone]([id number])  At least 40 percent but less than 60 percent impaired, limited or restricted    Changing and Maintaining Body Position Goal Status ([id number])  At least 20 percent but less than 40 percent impaired, limited or restricted        Problem List Patient Active Problem List   Diagnosis Date Noted  . Primary osteoarthritis of right knee 10/09/2017  . Lumbar spinal stenosis post right sided L2-L3 and L3-L4 hemilaminectomy, facetectomy and foraminotomy 08/01/2016  . Neck pain 05/18/2014  . Osteoarthritis of right hip 04/30/2014    Roderic ScarceSusan Shaver PT  10/22/2017, 11:36 AM  [facility] [address] [place], [place], [address] Phone: [phone]   Fax:  [phone]  Name: Randy Ortega MRN: [id number] Date of Birth: 04/13/1941

## 2017-10-22 NOTE — Patient Instructions (Addendum)
Lumbar Rotation (Non-Weight Bearing)    Feet on floor, slowly rock knees from side to side in small, pain-free range of motion. Allow lower back to rotate slightly. Repeat _2___ times per set. Do _1___ sets per session. Do _1___ sessions per day. Repeat on the other leg. Hold 20-30 sec.   Outer Hip Stretch: Reclined IT Band Stretch (Strap)    Strap around opposite foot, pull across only as far as possible with shoulders on mat. Hold for _20-30___ secs. Repeat __2__ times each leg. Once a day.   Trigger Point Dry Needling  . What is Trigger Point Dry Needling (DN)? o DN is a physical therapy technique used to treat muscle pain and dysfunction. Specifically, DN helps deactivate muscle trigger points (muscle knots).  o A thin filiform needle is used to penetrate the skin and stimulate the underlying trigger point. The goal is for a local twitch response (LTR) to occur and for the trigger point to relax. No medication of any kind is injected during the procedure.   . What Does Trigger Point Dry Needling Feel Like?  o The procedure feels different for each individual patient. Some patients report that they do not actually feel the needle enter the skin and overall the process is not painful. Very mild bleeding may occur. However, many patients feel a deep cramping in the muscle in which the needle was inserted. This is the local twitch response.   Marland Kitchen. How Will I feel after the treatment? o Soreness is normal, and the onset of soreness may not occur for a few hours. Typically this soreness does not last longer than two days.  o Bruising is uncommon, however; ice can be used to decrease any possible bruising.  o In rare cases feeling tired or nauseous after the treatment is normal. In addition, your symptoms may get worse before they get better, this period will typically not last longer than 24 hours.   . What Can I do After My Treatment? o Increase your hydration by drinking more water for the  next 24 hours. o You may place ice or heat on the areas treated that have become sore, however, do not use heat on inflamed or bruised areas. Heat often brings more relief post needling. o You can continue your regular activities, but vigorous activity is not recommended initially after the treatment for 24 hours. o DN is best combined with other physical therapy such as strengthening, stretching, and other therapies.

## 2017-10-29 ENCOUNTER — Ambulatory Visit: Payer: Medicare Other | Admitting: Physical Therapy

## 2017-10-29 ENCOUNTER — Telehealth: Payer: Self-pay | Admitting: Sports Medicine

## 2017-10-29 DIAGNOSIS — G8929 Other chronic pain: Secondary | ICD-10-CM | POA: Diagnosis not present

## 2017-10-29 DIAGNOSIS — M6281 Muscle weakness (generalized): Secondary | ICD-10-CM

## 2017-10-29 DIAGNOSIS — M62830 Muscle spasm of back: Secondary | ICD-10-CM | POA: Diagnosis not present

## 2017-10-29 DIAGNOSIS — M5441 Lumbago with sciatica, right side: Secondary | ICD-10-CM

## 2017-10-29 NOTE — Patient Instructions (Signed)
On Elbows (Prone)    Rise up on elbows as high as possible, keeping hips on floor. Hold _3-5___ mins. Repeat __1__ times per set. Do ____ sets per session. Do __3__ sessions per day.  Therapeutic - Prone Press-Up    Push up so chest is off floor and back is arched. Do not raise hips. Hold __1-2__ seconds. Return to prone position. Repeat __10__ times.

## 2017-10-29 NOTE — Therapy (Signed)
[place], [place], [address] Phone: [phone]   Fax:  [phone]  Physical Therapy Treatment  Patient Details  Name: Zephyr Ridley MRN: [id number] Date of Birth: 1941/01/12 Referring Provider: Dr Dianah Field   Encounter Date: 10/29/2017  PT End of Session - 10/29/17 1020    Visit Number  2    Number of Visits  4    Date for PT Re-Evaluation  11/19/17    PT Start Time  1020    PT Stop Time  1121    PT Time Calculation (min)  61 min    Activity Tolerance  Patient tolerated treatment well       Past Medical History:  Diagnosis Date  . Psoriasis     No past surgical history on file.  There were no vitals filed for this visit.  Subjective Assessment - 10/29/17 1020    Subjective  Pt reports he had some relief after the DN for about 24 hrs, then the pain returned to the same intensity.     Currently in Pain?  Yes    Pain Score  9     Pain Location  Back    Pain Orientation  Right    Pain Descriptors / Indicators  Sharp;Stabbing    Pain Type  Chronic pain    Pain Radiating Towards  anterior Rt thigh    Aggravating Factors   standing and walking    Pain Relieving Factors  sitting                      [facility] Adult PT Treatment/Exercise - 10/29/17 0001      Lumbar Exercises: Prone   Other Prone Lumbar Exercises  prone on elbows. moved pain up into bak and out of thigh then prone press ups      Modalities   Modalities  Electrical Stimulation;Moist Heat      Moist Heat Therapy   Number Minutes Moist Heat  20 Minutes    Moist Heat Location  Lumbar Spine      Electrical Stimulation   Electrical Stimulation Location  Rt low back    Electrical Stimulation Action  IFC    Electrical Stimulation Parameters   to tolerance    Electrical Stimulation Goals  Tone;Pain      Manual Therapy   Manual Therapy  Soft tissue mobilization    Joint Mobilization  grade III CPA and Rt  UPA mobs to lumbar spine with focus on L4, very hypomobile initially    Soft tissue mobilization  STM to Rt lumbar parsapinals.        Trigger Point Dry Needling - 10/29/17 1041    Consent Given?  Yes    Education Handout Provided  No    Muscles Treated Upper Body  Longissimus    Longissimus Response  Twitch response elicited;Palpable increased muscle length Rt L4-2 with stim           PT Education - 10/29/17 1026    Education provided  Yes    Education Details  prone press ups and POE    Person(s) Educated  Patient    Methods  Explanation;Handout    Comprehension  Verbalized understanding;Returned demonstration          PT Long Term Goals - 10/29/17 1023      PT LONG TERM GOAL #1   Title  I with advanced HEP ( 1217/18)     Status  On-going  PT LONG TERM GOAL #2   Title  report =/> 75% decrease pain in the Rt low back ( 11/19/17)     Status  On-going      PT LONG TERM GOAL #3   Title  demo lumbar ROM without pain ( 11/19/17)     Status  On-going      PT LONG TERM GOAL #4   Title  improve FOTO =/< 39% limited CJ level ( 11/19/17)     Status  On-going            Plan - 10/29/17 1048    Clinical Impression Statement  This is Johnnys second visit, no goals met.  He had some relief after his first treatment lasting about 24 hrs then the pain returned.  He is hypomobile in the lumbar spine and still has tightness in the lumbar paraspinals.     Rehab Potential  Excellent    PT Frequency  1x / week    PT Duration  4 weeks    PT Treatment/Interventions  Dry needling;Manual techniques;Patient/family education;Therapeutic activities;Ultrasound;Cryotherapy;Electrical Stimulation;Therapeutic exercise;Passive range of motion    PT Next Visit Plan  continue mobs and manual work, add in pelvic and lumbar mobility ex.     Consulted and Agree with Plan of Care  Patient       Patient will benefit from skilled therapeutic intervention in order to improve the following  deficits and impairments:  Hypomobility, Decreased strength, Increased muscle spasms  Visit Diagnosis: Chronic right-sided low back pain with right-sided sciatica  Muscle spasm of back  Muscle weakness (generalized)     Problem List Patient Active Problem List   Diagnosis Date Noted  . Primary osteoarthritis of right knee 10/09/2017  . Lumbar spinal stenosis post right sided L2-L3 and L3-L4 hemilaminectomy, facetectomy and foraminotomy 08/01/2016  . Neck pain 05/18/2014  . Osteoarthritis of right hip 04/30/2014    Jeral Pinch PT  10/29/2017, 11:02 AM  [facility] [place], [place], [address] Phone: [phone]   Fax:  [phone]  Name: Suleiman Finigan MRN: [id number] Date of Birth: 04-15-1941

## 2017-10-29 NOTE — Telephone Encounter (Signed)
Patient would like to know if we received medical records for him from Dr. Marikay Alaravid Jones. I did not see anything in patient's chart, but wanted to check if you remember seeing anything for him. If not, I will resend the records request. Let me know. Thanks!

## 2017-10-29 NOTE — Telephone Encounter (Signed)
Yes we got some, I sent it to scan.  Could take some time to get uploaded.  Ultimately he will need a new MRI with contrast if no better at the followup to look for reherniation.

## 2017-11-05 ENCOUNTER — Ambulatory Visit: Payer: Medicare Other | Admitting: Rehabilitative and Restorative Service Providers"

## 2017-11-05 ENCOUNTER — Encounter: Payer: Self-pay | Admitting: Rehabilitative and Restorative Service Providers"

## 2017-11-05 DIAGNOSIS — M5441 Lumbago with sciatica, right side: Secondary | ICD-10-CM

## 2017-11-05 DIAGNOSIS — M62830 Muscle spasm of back: Secondary | ICD-10-CM

## 2017-11-05 DIAGNOSIS — M6281 Muscle weakness (generalized): Secondary | ICD-10-CM

## 2017-11-05 DIAGNOSIS — M2560 Stiffness of unspecified joint, not elsewhere classified: Secondary | ICD-10-CM | POA: Diagnosis not present

## 2017-11-05 DIAGNOSIS — M2569 Stiffness of other specified joint, not elsewhere classified: Secondary | ICD-10-CM

## 2017-11-05 DIAGNOSIS — G8929 Other chronic pain: Secondary | ICD-10-CM

## 2017-11-05 NOTE — Patient Instructions (Signed)

## 2017-11-05 NOTE — Therapy (Addendum)
[place] [address] [facility] [place], [place], [address] Phone: [phone]   Fax:  [phone]  Physical Therapy Treatment  Patient Details  Name: Randy Ortega MRN: [id number] Date of Birth: 03-21-1941 Referring Provider: Dr Dianah Field   Encounter Date: 11/05/2017  PT End of Session - 11/05/17 1055    Visit Number  3    Number of Visits  4    Date for PT Re-Evaluation  11/19/17    PT Start Time  8867    PT Stop Time  1115    PT Time Calculation (min)  57 min    Activity Tolerance  Patient tolerated treatment well       Past Medical History:  Diagnosis Date  . Psoriasis     History reviewed. No pertinent surgical history.  There were no vitals filed for this visit.  Subjective Assessment - 11/05/17 1025    Subjective  Back is hurting just the same. He has not improved any. He has some relief from the DN for about 24 hours. Sits in the recliner at home.     Currently in Pain?  Yes    Pain Score  5     Pain Location  Back    Pain Orientation  Right    Pain Descriptors / Indicators  Sharp;Stabbing    Pain Type  Chronic pain    Pain Radiating Towards  Rt hip into Rt thigh and knee     Pain Onset  More than a month ago    Pain Frequency  Constant 2-8/10         [facility] PT Assessment - 11/05/17 0001      Assessment   Medical Diagnosis  Lumbar DDD    Referring Provider  Dr Dianah Field    Onset Date/Surgical Date  01/05/17    Hand Dominance  Right    Next MD Visit  11/06/17    Prior Therapy  before his surgery in2017      Posture/Postural Control   Postural Limitations  -- upper body shifted Rt       AROM   AROM Assessment Site  Lumbar    Lumbar Flexion  WNL, to floor, with stretch pain in Rt low back    Lumbar Extension  WNL, nagging pain in Rt low back    Lumbar - Right Side Bend  WNL, pain    Lumbar - Left Side Bend  WNL, pain     Lumbar - Right Rotation  WNL    Lumbar - Left Rotation  WNL       Flexibility   Soft Tissue Assessment /Muscle Length  -- hypomobile through both hips       Palpation   Spinal mobility  hypomobile in lumbar spine    Palpation comment  tender and tight in Rt QL, lumbar paraspinals.                   [facility] Adult PT Treatment/Exercise - 11/05/17 0001      Lumbar Exercises: Stretches   Prone on Elbows Stretch  -- 3 min     Press Ups  -- 1-2 sec x 10      Modalities   Modalities  Electrical Stimulation;Moist Heat      Moist Heat Therapy   Number Minutes Moist Heat  20 Minutes    Moist Heat Location  Lumbar Spine      Electrical Stimulation   Electrical Stimulation Location  Rt low back  Electrical Stimulation Action  IFC    Electrical Stimulation Parameters  to tolerance    Electrical Stimulation Goals  Tone;Pain      Manual Therapy   Manual Therapy  Soft tissue mobilization    Joint Mobilization  grade III CPA and Rt UPA mobs to lumbar spine; PA mobs Rt hip     Soft tissue mobilization  STM to Rt lumbar parsapinals, QL, hip abductors     Passive ROM  passive stretch knee flexion in prone ~30 ssec x 3        Trigger Point Dry Needling - 11/05/17 1206    Consent Given?  Yes    Muscles Treated Upper Body  Longissimus;Quadratus Lumborum Rt with estim     Muscles Treated Lower Body  Piriformis;Gluteus maximus Rt with estim     Longissimus Response  Palpable increased muscle length lumbar paraspinals Rt    Gluteus Maximus Response  Palpable increased muscle length Rt    Piriformis Response  Palpable increased muscle length Rt           PT Education - 11/05/17 1054    Education provided  Yes    Education Details  education re- sitting positions and chairs     Person(s) Educated  Patient    Methods  Explanation    Comprehension  Verbalized understanding          PT Long Term Goals - 11/05/17 1056      PT LONG TERM GOAL #1   Title  I with advanced HEP ( 1217/18)     Time  4    Period  Weeks    Status  On-going       PT LONG TERM GOAL #2   Title  report =/> 75% decrease pain in the Rt low back ( 11/19/17)     Time  4    Period  Weeks    Status  On-going      PT LONG TERM GOAL #3   Title  demo lumbar ROM without pain ( 11/19/17)     Time  4    Period  Weeks    Status  On-going      PT LONG TERM GOAL #4   Title  improve FOTO =/< 39% limited CJ level ( 11/19/17)     Time  4    Period  Weeks            Plan - 11/05/17 1211    Clinical Impression Statement  Loraine has ben seen for three PT visits. He continues to have pain in the Rt LB and into the Rt hip and anterior thigh. He has relief of symptoms for ~24 hours following DN and manual work. He continues to demonstrate hypomobility through the lumbar spine and tightness in the lumbar paraspinals, QL, hip abductors on Rt. Instructed patient in sitting poistions and chairs for home - he has been sitting in a recliner. He returns to MD tomorrow.     Rehab Potential  Good    PT Frequency  1x / week    PT Duration  4 weeks    PT Treatment/Interventions  Dry needling;Manual techniques;Patient/family education;Therapeutic activities;Ultrasound;Cryotherapy;Electrical Stimulation;Therapeutic exercise;Passive range of motion    PT Next Visit Plan  continue mobs and manual work, add in pelvic and lumbar mobility exercise as tolerated  - returns to MD tomorrow     Consulted and Agree with Plan of Care  Patient       Patient will benefit  from skilled therapeutic intervention in order to improve the following deficits and impairments:  Hypomobility, Decreased strength, Increased muscle spasms  Visit Diagnosis: Chronic right-sided low back pain with right-sided sciatica  Muscle spasm of back  Muscle weakness (generalized)  Back stiffness     Problem List Patient Active Problem List   Diagnosis Date Noted  . Primary osteoarthritis of right knee 10/09/2017  . 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 Neck pain 05/18/2014  . Osteoarthritis of right hip 04/30/2014    Faryn Sieg Nilda Simmer PT, MPH  11/05/2017, 12:16 PM  [facility] [place] [address] [place], [place], [address] Phone: [phone]   Fax:  [phone]  Name: Gerren Hoffmeier MRN: [id number] Date of Birth: 09/10/1941   PHYSICAL THERAPY DISCHARGE SUMMARY  Visits from Start of Care: 3  Current functional level related to goals / functional outcomes: unknown   Remaining deficits: unknown   Education / Equipment: HEP and DN Plan: Patient agrees to discharge.  Patient goals were not met. Patient is being discharged due to not returning since the last visit.  Patient having an MRI????      Jeral Pinch, PT 12/31/17 8:41 AM

## 2017-11-06 ENCOUNTER — Ambulatory Visit: Payer: Medicare Other | Admitting: Sports Medicine

## 2017-11-06 ENCOUNTER — Other Ambulatory Visit: Payer: Self-pay | Admitting: Sports Medicine

## 2017-11-06 ENCOUNTER — Encounter: Payer: Self-pay | Admitting: Sports Medicine

## 2017-11-06 DIAGNOSIS — M48061 Spinal stenosis, lumbar region without neurogenic claudication: Secondary | ICD-10-CM | POA: Diagnosis not present

## 2017-11-06 DIAGNOSIS — M542 Cervicalgia: Secondary | ICD-10-CM

## 2017-11-06 MED ORDER — TRAMADOL HCL 50 MG PO TABS: ORAL_TABLET | ORAL | 3 refills | Status: DC

## 2017-11-06 NOTE — Progress Notes (Addendum)
  Subjective:    CC: Back pain  HPI: Randy Ortega is a pleasant 76 year old male, he is post L2-L3 and L3-L4 hemilaminectomy with facetectomy, initially he did well but then developed a recurrence of pain on the right side with radiation down the anterior thigh.  We have placed him through physical therapy, unfortunately continues to have pain.  He has been resistant to taking any medications.  Symptoms are moderate, persistent, he declines any constitutional symptoms or bowel or bladder dysfunction, no saddle numbness.  Past medical history:  Negative.  See flowsheet/record as well for more information.  Surgical history: Negative.  See flowsheet/record as well for more information.  Family history: Negative.  See flowsheet/record as well for more information.  Social history: Negative.  See flowsheet/record as well for more information.  Allergies, and medications have been entered into the medical record, reviewed, and no changes needed.   Review of Systems: No fevers, chills, night sweats, weight loss, chest pain, or shortness of breath.   Objective:    General: Well Developed, well nourished, and in no acute distress.  Neuro: Alert and oriented x3, extra-ocular muscles intact, sensation grossly intact.  HEENT: Normocephalic, atraumatic, pupils equal round reactive to light, neck supple, no masses, no lymphadenopathy, thyroid nonpalpable.  Skin: Warm and dry, no rashes. Cardiac: Regular rate and rhythm, no murmurs rubs or gallops, no lower extremity edema.  Respiratory: Clear to auscultation bilaterally. Not using accessory muscles, speaking in full sentences.  Impression and Recommendations:    Lumbar spinal stenosis post right sided L2-L3 and L3-L4 hemilaminectomy, facetectomy and foraminotomy Recurrence of pain after hemilaminectomy and facetectomy at L2-L3 and L3-L4. At this point I am going to repeat an MRI, this time with IV contrast, in anticipation of right sided lumbar  epidural. Tramadol for pain in the meantime. He had a normal renal function at the end of October at [facility]. Return to see me to go over MRI results.  Orders placed for right L3-L4 transforaminal epidural.  Return 1 month after injection to evaluate relief.  I spent 25 minutes with this patient, greater than 50% was face-to-face time counseling regarding the above diagnoses ___________________________________________ Ihor Austinhomas J. Benjamin Stainhekkekandam, M.D., ABFM., CAQSM. Primary Care and Sports Medicine [facility]  Adjunct Instructor of Family Medicine  [employer]

## 2017-11-06 NOTE — Assessment & Plan Note (Addendum)
Recurrence of pain after hemilaminectomy and facetectomy at L2-L3 and L3-L4. At this point I am going to repeat an MRI, this time with IV contrast, in anticipation of right sided lumbar epidural. Tramadol for pain in the meantime. He had a normal renal function at the end of October at [facility]. Return to see me to go over MRI results.  Orders placed for right L3-L4 transforaminal epidural.  Return 1 month after injection to evaluate relief.

## 2017-11-06 NOTE — Progress Notes (Signed)
Last labs were greater than 236 weeks old, new order placed.

## 2017-11-07 LAB — COMPLETE METABOLIC PANEL WITHOUT GFR
AG Ratio: 2 (calc) (ref 1.0–2.5)
ALT: 22 U/L (ref 9–46)
AST: 21 U/L (ref 10–35)
BUN: 19 mg/dL (ref 7–25)
Calcium: 9.4 mg/dL (ref 8.6–10.3)
Chloride: 103 mmol/L (ref 98–110)
GFR, Est Non African American: 79 mL/min/1.73m2 (ref >=60)
Glucose, Bld: 114 mg/dL — ABNORMAL HIGH (ref 65–99)
Potassium: 4.1 mmol/L (ref 3.5–5.3)
Total Protein: 6.4 g/dL (ref 6.1–8.1)

## 2017-11-07 LAB — COMPLETE METABOLIC PANEL WITH GFR
Albumin: 4.3 g/dL (ref 3.6–5.1)
Alkaline phosphatase (APISO): 91 U/L (ref 40–115)
CO2: 29 mmol/L (ref 20–32)
Creat: 0.95 mg/dL (ref 0.70–1.18)
GFR, Est African American: 91 mL/min/{1.73_m2} (ref >=60)
Globulin: 2.1 g/dL (calc) (ref 1.9–3.7)
Sodium: 139 mmol/L (ref 135–146)
Total Bilirubin: 0.7 mg/dL (ref 0.2–1.2)

## 2017-11-10 ENCOUNTER — Ambulatory Visit (HOSPITAL_BASED_OUTPATIENT_CLINIC_OR_DEPARTMENT_OTHER): Payer: Medicare Other

## 2017-11-17 ENCOUNTER — Ambulatory Visit (HOSPITAL_BASED_OUTPATIENT_CLINIC_OR_DEPARTMENT_OTHER)
Admission: RE | Admit: 2017-11-17 | Discharge: 2017-11-17 | Disposition: A | Discharge status: Home / Self Care | Payer: Medicare Other | Source: Ambulatory Visit | Attending: Sports Medicine | Admitting: Sports Medicine

## 2017-11-17 ENCOUNTER — Other Ambulatory Visit (HOSPITAL_BASED_OUTPATIENT_CLINIC_OR_DEPARTMENT_OTHER): Payer: Medicare Other

## 2017-11-17 DIAGNOSIS — M5136 Other intervertebral disc degeneration, lumbar region: Secondary | ICD-10-CM | POA: Insufficient documentation

## 2017-11-17 DIAGNOSIS — M48061 Spinal stenosis, lumbar region without neurogenic claudication: Secondary | ICD-10-CM

## 2017-11-17 DIAGNOSIS — Z9889 Other specified postprocedural states: Secondary | ICD-10-CM | POA: Diagnosis not present

## 2017-11-17 MED ORDER — GADOBENATE DIMEGLUMINE 529 MG/ML IV SOLN
10.0000 mL | Freq: Once | INTRAVENOUS | Status: AC | PRN
  Administered 2017-11-17: 10 mL via INTRAVENOUS

## 2017-11-19 NOTE — Addendum Note (Signed)
Addended by: Monica BectonHEKKEKANDAM, THOMAS J on: 11/19/2017 04:55 PM   Modules accepted: Orders

## 2017-11-20 ENCOUNTER — Telehealth: Payer: Self-pay | Admitting: Sports Medicine

## 2017-11-20 NOTE — Telephone Encounter (Signed)
-----   Message from Monica Bectonhomas J Thekkekandam, MD sent at 11/19/2017  4:54 PM EST ----- Orders placed [facility], [facility]

## 2017-11-20 NOTE — Telephone Encounter (Signed)
Pt called about the MRI-pt aware results but have more questions.the patient stated wants to talk to Dr. Karie Schwalbe or email to patient chart

## 2017-11-20 NOTE — Telephone Encounter (Signed)
He can email me with questions.

## 2017-11-21 ENCOUNTER — Encounter: Payer: Self-pay | Admitting: Sports Medicine

## 2017-12-11 ENCOUNTER — Ambulatory Visit
Admission: RE | Admit: 2017-12-11 | Discharge: 2017-12-11 | Disposition: A | Discharge status: Home / Self Care | Payer: Medicare Other | Source: Ambulatory Visit | Attending: Sports Medicine | Admitting: Sports Medicine

## 2017-12-11 MED ORDER — IOPAMIDOL (ISOVUE-M 200) INJECTION 41%
1.0000 mL | Freq: Once | INTRAMUSCULAR | Status: AC
  Administered 2017-12-11: 1 mL via EPIDURAL

## 2017-12-11 MED ORDER — METHYLPREDNISOLONE ACETATE 40 MG/ML INJ SUSP (RADIOLOG
120.0000 mg | Freq: Once | INTRAMUSCULAR | Status: AC
  Administered 2017-12-11: 120 mg via EPIDURAL

## 2017-12-11 NOTE — Discharge Instructions (Signed)

## 2018-01-11 ENCOUNTER — Ambulatory Visit: Payer: Medicare Other | Admitting: Sports Medicine

## 2018-01-11 ENCOUNTER — Encounter: Payer: Self-pay | Admitting: Sports Medicine

## 2018-01-11 DIAGNOSIS — M48061 Spinal stenosis, lumbar region without neurogenic claudication: Secondary | ICD-10-CM | POA: Diagnosis not present

## 2018-01-11 MED ORDER — TRAMADOL HCL 50 MG PO TABS: 100.0000 mg | ORAL_TABLET | Freq: Two times a day (BID) | ORAL | 3 refills | Status: DC

## 2018-01-11 MED ORDER — GABAPENTIN 300 MG PO CAPS: ORAL_CAPSULE | ORAL | 3 refills | Status: DC

## 2018-01-11 NOTE — Assessment & Plan Note (Addendum)
Randy Ortega had complete but temporary pain relief after his right L3-L4 transforaminal epidural, MRI did show severe neuroforaminal stenosis on the right at L3-L4. Increasing tramadol to 100 mg twice a day, adding gabapentin at bedtime. I would like him to discuss this again with Dr. Yetta BarreJones to see if a repeat decompression at this level would be an option. Return to see me 1 month after his second epidural.

## 2018-01-11 NOTE — Progress Notes (Signed)
  Subjective:    CC: Back pain  HPI: Randy Ortega is a pleasant 77 year old male, he is post a right L3-L4 laminectomy and microdiscectomy, recent MRI did show persistence of severe right foraminal stenosis likely affecting the right L3 nerve root.  We proceeded with a right L3-L4 transforaminal epidural that provided complete pain relief for several days, with recurrence of pain.  Moderate, persistent, it does keep him up at night.  No bowel or bladder dysfunction, saddle numbness, constitutional symptoms.  I reviewed the past medical history, family history, social history, surgical history, and allergies today and no changes were needed.  Please see the problem list section below in epic for further details.  Past Medical History: Past Medical History:  Diagnosis Date  . Psoriasis    Past Surgical History: No past surgical history on file. Social History: Social History   Socioeconomic History  . Marital status: Married    Spouse name: None  . Number of children: None  . Years of education: None  . Highest education level: None  Social Needs  . Financial resource strain: None  . Food insecurity - worry: None  . Food insecurity - inability: None  . Transportation needs - medical: None  . Transportation needs - non-medical: None  Occupational History  . None  Tobacco Use  . Smoking status: Never Smoker  . Smokeless tobacco: Never Used  Substance and Sexual Activity  . Alcohol use: None  . Drug use: No  . Sexual activity: Yes    Partners: Female  Other Topics Concern  . None  Social History Narrative  . None   Family History: No family history on file. Allergies: Allergies  Allergen Reactions  . Choline Fenofibrate Other (See Comments)    Leg cramps  . Niacin Itching   Medications: See med rec.  Review of Systems: No fevers, chills, night sweats, weight loss, chest pain, or shortness of breath.   Objective:    General: Well Developed, well nourished, and in no  acute distress.  Neuro: Alert and oriented x3, extra-ocular muscles intact, sensation grossly intact.  HEENT: Normocephalic, atraumatic, pupils equal round reactive to light, neck supple, no masses, no lymphadenopathy, thyroid nonpalpable.  Skin: Warm and dry, no rashes. Cardiac: Regular rate and rhythm, no murmurs rubs or gallops, no lower extremity edema.  Respiratory: Clear to auscultation bilaterally. Not using accessory muscles, speaking in full sentences.  Impression and Recommendations:    Right L3 radiculopathy Randy Ortega had complete but temporary pain relief after his right L3-L4 transforaminal epidural, MRI did show severe neuroforaminal stenosis on the right at L3-L4. Increasing tramadol to 100 mg twice a day, adding gabapentin at bedtime. I would like him to discuss this again with Dr. Yetta BarreJones to see if a repeat decompression at this level would be an option. Return to see me 1 month after his second epidural.  I spent 25 minutes with this patient, greater than 50% was face-to-face time counseling regarding the above diagnoses ___________________________________________ Ihor Austinhomas J. Benjamin Stainhekkekandam, M.D., ABFM., CAQSM. Primary Care and Sports Medicine [facility]  Adjunct Instructor of Family Medicine  [employer]

## 2018-01-17 ENCOUNTER — Encounter: Payer: Self-pay | Admitting: Sports Medicine

## 2018-01-25 ENCOUNTER — Ambulatory Visit
Admission: RE | Admit: 2018-01-25 | Discharge: 2018-01-25 | Disposition: A | Discharge status: Home / Self Care | Payer: Medicare Other | Source: Ambulatory Visit | Attending: Sports Medicine | Admitting: Sports Medicine

## 2018-01-25 MED ORDER — IOPAMIDOL (ISOVUE-M 200) INJECTION 41%
1.0000 mL | Freq: Once | INTRAMUSCULAR | Status: AC
  Administered 2018-01-25: 1 mL via EPIDURAL

## 2018-01-25 MED ORDER — METHYLPREDNISOLONE ACETATE 40 MG/ML INJ SUSP (RADIOLOG
120.0000 mg | Freq: Once | INTRAMUSCULAR | Status: AC
  Administered 2018-01-25: 120 mg via EPIDURAL

## 2018-01-25 NOTE — Discharge Instructions (Signed)

## 2018-02-25 ENCOUNTER — Ambulatory Visit: Payer: Medicare Other | Admitting: Sports Medicine

## 2018-02-25 ENCOUNTER — Encounter: Payer: Self-pay | Admitting: Sports Medicine

## 2018-02-25 DIAGNOSIS — M48061 Spinal stenosis, lumbar region without neurogenic claudication: Secondary | ICD-10-CM

## 2018-02-25 NOTE — Assessment & Plan Note (Signed)
Temporary relief after his first right L3-L4 transforaminal epidural, he does have severe right-sided neuroforaminal stenosis at L3-L4. At his most recent epidural he had fantastic concordant symptoms during the injection, and essential complete relief of his radicular pain and back pain. He was taking tramadol 100 mg twice a day, gabapentin, he has since stopped these as he does not need them anymore. Return to see me as needed.

## 2018-02-25 NOTE — Progress Notes (Signed)
  Subjective:    CC: Follow-up  HPI: Lumbar radiculitis: Completely resolved after his second right L3-L4 transforaminal epidural.  Stopped all pain medication.  I reviewed the past medical history, family history, social history, surgical history, and allergies today and no changes were needed.  Please see the problem list section below in epic for further details.  Past Medical History: Past Medical History:  Diagnosis Date  . Psoriasis    Past Surgical History: No past surgical history on file. Social History: Social History   Socioeconomic History  . Marital status: Married    Spouse name: Not on file  . Number of children: Not on file  . Years of education: Not on file  . Highest education level: Not on file  Occupational History  . Not on file  Social Needs  . Financial resource strain: Not on file  . Food insecurity:    Worry: Not on file    Inability: Not on file  . Transportation needs:    Medical: Not on file    Non-medical: Not on file  Tobacco Use  . Smoking status: Never Smoker  . Smokeless tobacco: Never Used  Substance and Sexual Activity  . Alcohol use: Not on file  . Drug use: No  . Sexual activity: Yes    Partners: Female  Lifestyle  . Physical activity:    Days per week: Not on file    Minutes per session: Not on file  . Stress: Not on file  Relationships  . Social connections:    Talks on phone: Not on file    Gets together: Not on file    Attends religious service: Not on file    Active member of club or organization: Not on file    Attends meetings of clubs or organizations: Not on file    Relationship status: Not on file  Other Topics Concern  . Not on file  Social History Narrative  . Not on file   Family History: No family history on file. Allergies: Allergies  Allergen Reactions  . Choline Fenofibrate Other (See Comments)    Leg cramps  . Niacin Itching   Medications: See med rec.  Review of Systems: No fevers, chills,  night sweats, weight loss, chest pain, or shortness of breath.   Objective:    General: Well Developed, well nourished, and in no acute distress.  Neuro: Alert and oriented x3, extra-ocular muscles intact, sensation grossly intact.  HEENT: Normocephalic, atraumatic, pupils equal round reactive to light, neck supple, no masses, no lymphadenopathy, thyroid nonpalpable.  Skin: Warm and dry, no rashes. Cardiac: Regular rate and rhythm, no murmurs rubs or gallops, no lower extremity edema.  Respiratory: Clear to auscultation bilaterally. Not using accessory muscles, speaking in full sentences.  Impression and Recommendations:    Right L3 radiculopathy Temporary relief after his first right L3-L4 transforaminal epidural, he does have severe right-sided neuroforaminal stenosis at L3-L4. At his most recent epidural he had fantastic concordant symptoms during the injection, and essential complete relief of his radicular pain and back pain. He was taking tramadol 100 mg twice a day, gabapentin, he has since stopped these as he does not need them anymore. Return to see me as needed. ___________________________________________ Ihor Austinhomas J. Benjamin Stainhekkekandam, M.D., ABFM., CAQSM. Primary Care and Sports Medicine [facility]  Adjunct Instructor of Family Medicine  [employer]

## 2018-06-14 ENCOUNTER — Encounter: Payer: Self-pay | Admitting: Sports Medicine

## 2018-06-14 ENCOUNTER — Ambulatory Visit: Payer: Medicare Other | Admitting: Sports Medicine

## 2018-06-14 DIAGNOSIS — M48061 Spinal stenosis, lumbar region without neurogenic claudication: Secondary | ICD-10-CM | POA: Diagnosis not present

## 2018-06-14 MED ORDER — GABAPENTIN 300 MG PO CAPS: ORAL_CAPSULE | ORAL | 3 refills | Status: DC

## 2018-06-14 NOTE — Progress Notes (Signed)
Subjective:    CC: Back pain  HPI: Randy Ortega is a pleasant 77 year old male with right L3 radiculopathy, he has a history of a right L3 laminectomy, reherniation with severe stenosis at L3-L4, back earlier this year we did two L3-L4 transforaminal epidurals that provided complete relief of his pain, he is now having recurrence of symptoms.  Moderate, persistent with radiation down the right leg.  No bowel or bladder dysfunction, saddle numbness, constitutional symptoms.  I reviewed the past medical history, family history, social history, surgical history, and allergies today and no changes were needed.  Please see the problem list section below in epic for further details.  Past Medical History: Past Medical History:  Diagnosis Date  . Psoriasis    Past Surgical History: No past surgical history on file. Social History: Social History   Socioeconomic History  . Marital status: Married    Spouse name: Not on file  . Number of children: Not on file  . Years of education: Not on file  . Highest education level: Not on file  Occupational History  . Not on file  Social Needs  . Financial resource strain: Not on file  . Food insecurity:    Worry: Not on file    Inability: Not on file  . Transportation needs:    Medical: Not on file    Non-medical: Not on file  Tobacco Use  . Smoking status: Never Smoker  . Smokeless tobacco: Never Used  Substance and Sexual Activity  . Alcohol use: Not on file  . Drug use: No  . Sexual activity: Yes    Partners: Female  Lifestyle  . Physical activity:    Days per week: Not on file    Minutes per session: Not on file  . Stress: Not on file  Relationships  . Social connections:    Talks on phone: Not on file    Gets together: Not on file    Attends religious service: Not on file    Active member of club or organization: Not on file    Attends meetings of clubs or organizations: Not on file    Relationship status: Not on file  Other  Topics Concern  . Not on file  Social History Narrative  . Not on file   Family History: No family history on file. Allergies: Allergies  Allergen Reactions  . Choline Fenofibrate Other (See Comments)    Leg cramps  . Niacin Itching   Medications: See med rec.  Review of Systems: No fevers, chills, night sweats, weight loss, chest pain, or shortness of breath.   Objective:    General: Well Developed, well nourished, and in no acute distress.  Neuro: Alert and oriented x3, extra-ocular muscles intact, sensation grossly intact.  HEENT: Normocephalic, atraumatic, pupils equal round reactive to light, neck supple, no masses, no lymphadenopathy, thyroid nonpalpable.  Skin: Warm and dry, no rashes. Cardiac: Regular rate and rhythm, no murmurs rubs or gallops, no lower extremity edema.  Respiratory: Clear to auscultation bilaterally. Not using accessory muscles, speaking in full sentences.  Impression and Recommendations:    Right L3 radiculopathy Previous right L3-L4 transforaminal epidurals x2 have provided fantastic relief, now having a recurrence of pain. Restart tramadol, restart gabapentin. Ordering another right L3-L4 transforaminal epidural.  I spent 25 minutes with this patient, greater than 50% was face-to-face time counseling regarding the above diagnoses ___________________________________________ Ihor Austinhomas J. Benjamin Stainhekkekandam, M.D., ABFM., CAQSM. Primary Care and Sports Medicine [facility]  Adjunct Instructor of  [employer]

## 2018-06-14 NOTE — Assessment & Plan Note (Signed)
Previous right L3-L4 transforaminal epidurals x2 have provided fantastic relief, now having a recurrence of pain. Restart tramadol, restart gabapentin. Ordering another right L3-L4 transforaminal epidural.

## 2018-06-25 ENCOUNTER — Ambulatory Visit
Admission: RE | Admit: 2018-06-25 | Discharge: 2018-06-25 | Disposition: A | Discharge status: Home / Self Care | Payer: Medicare Other | Source: Ambulatory Visit | Attending: Sports Medicine | Admitting: Sports Medicine

## 2018-06-25 MED ORDER — METHYLPREDNISOLONE ACETATE 40 MG/ML INJ SUSP (RADIOLOG
120.0000 mg | Freq: Once | INTRAMUSCULAR | Status: AC
  Administered 2018-06-25: 120 mg via EPIDURAL

## 2018-06-25 MED ORDER — IOPAMIDOL (ISOVUE-M 200) INJECTION 41%
1.0000 mL | Freq: Once | INTRAMUSCULAR | Status: AC
  Administered 2018-06-25: 1 mL via EPIDURAL

## 2018-06-25 NOTE — Discharge Instructions (Signed)

## 2018-07-12 ENCOUNTER — Encounter: Payer: Self-pay | Admitting: Sports Medicine

## 2018-07-12 ENCOUNTER — Ambulatory Visit: Payer: Medicare Other | Admitting: Sports Medicine

## 2018-07-12 DIAGNOSIS — M1711 Unilateral primary osteoarthritis, right knee: Secondary | ICD-10-CM | POA: Diagnosis not present

## 2018-07-12 DIAGNOSIS — M48061 Spinal stenosis, lumbar region without neurogenic claudication: Secondary | ICD-10-CM

## 2018-07-12 NOTE — Assessment & Plan Note (Signed)
Multilevel spinal stenosis. Good response to axial back pain with selective L3 epidural. Has some pain going down the leg but I think it is more from his knee. Tramadol once or less per day gives fantastic relief to his axial pain, so I think there is no need for surgical intervention. He can call me for refills. Declines gabapentin, he did have some hallucinations.

## 2018-07-12 NOTE — Progress Notes (Signed)
Subjective:    CC: Recheck back pain  HPI: Bethann BerkshireJohnny returns, he did well after his epidural, near complete resolution of his back pain.  Still has some pain that he localizes on the medial knee, moderate, persistent without radiation or mechanical symptoms, previous knee injection was almost a year ago.  I reviewed the past medical history, family history, social history, surgical history, and allergies today and no changes were needed.  Please see the problem list section below in epic for further details.  Past Medical History: Past Medical History:  Diagnosis Date  . Psoriasis    Past Surgical History: No past surgical history on file. Social History: Social History   Socioeconomic History  . Marital status: Married    Spouse name: Not on file  . Number of children: Not on file  . Years of education: Not on file  . Highest education level: Not on file  Occupational History  . Not on file  Social Needs  . Financial resource strain: Not on file  . Food insecurity:    Worry: Not on file    Inability: Not on file  . Transportation needs:    Medical: Not on file    Non-medical: Not on file  Tobacco Use  . Smoking status: Never Smoker  . Smokeless tobacco: Never Used  Substance and Sexual Activity  . Alcohol use: Not on file  . Drug use: No  . Sexual activity: Yes    Partners: Female  Lifestyle  . Physical activity:    Days per week: Not on file    Minutes per session: Not on file  . Stress: Not on file  Relationships  . Social connections:    Talks on phone: Not on file    Gets together: Not on file    Attends religious service: Not on file    Active member of club or organization: Not on file    Attends meetings of clubs or organizations: Not on file    Relationship status: Not on file  Other Topics Concern  . Not on file  Social History Narrative  . Not on file   Family History: No family history on file. Allergies: Allergies  Allergen Reactions  .  Choline Fenofibrate Other (See Comments)    Leg cramps  . Metformin And Related Diarrhea  . Niacin Itching   Medications: See med rec.  Review of Systems: No fevers, chills, night sweats, weight loss, chest pain, or shortness of breath.   Objective:    General: Well Developed, well nourished, and in no acute distress.  Neuro: Alert and oriented x3, extra-ocular muscles intact, sensation grossly intact.  HEENT: Normocephalic, atraumatic, pupils equal round reactive to light, neck supple, no masses, no lymphadenopathy, thyroid nonpalpable.  Skin: Warm and dry, no rashes. Cardiac: Regular rate and rhythm, no murmurs rubs or gallops, no lower extremity edema.  Respiratory: Clear to auscultation bilaterally. Not using accessory muscles, speaking in full sentences. Right knee: Normal to inspection with no erythema or effusion or obvious bony abnormalities. Tender to palpation of the medial joint line ROM normal in flexion and extension and lower leg rotation. Ligaments with solid consistent endpoints including ACL, PCL, LCL, MCL. Negative Mcmurray's and provocative meniscal tests. Non painful patellar compression. Patellar and quadriceps tendons unremarkable. Hamstring and quadriceps strength is normal.  Procedure: Real-time Ultrasound Guided Injection of right knee Device: GE Logiq E  Verbal informed consent obtained.  Time-out conducted.  Noted no overlying erythema, induration, or other signs of  local infection.  Skin prepped in a sterile fashion.  Local anesthesia: Topical Ethyl chloride.  With sterile technique and under real time ultrasound guidance: 1 cc Kenalog 40, 2 cc lidocaine, 2 cc bupivacaine injected easily. Completed without difficulty  Pain immediately resolved suggesting accurate placement of the medication.  Advised to call if fevers/chills, erythema, induration, drainage, or persistent bleeding.  Images permanently stored and available for review in the ultrasound  unit.  Impression: Technically successful ultrasound guided injection.  Impression and Recommendations:    Primary osteoarthritis of right knee Previous injection was 9 months ago, repeat right knee injection today. Return in 1 month.  Right L3 radiculopathy Multilevel spinal stenosis. Good response to axial back pain with selective L3 epidural. Has some pain going down the leg but I think it is more from his knee. Tramadol once or less per day gives fantastic relief to his axial pain, so I think there is no need for surgical intervention. He can call me for refills. Declines gabapentin, he did have some hallucinations.  ___________________________________________ Ihor Austin. Benjamin Stain, M.D., ABFM., CAQSM. Primary Care and Sports Medicine [facility]  Adjunct Instructor of Family Medicine  [employer]

## 2018-07-12 NOTE — Assessment & Plan Note (Signed)
Previous injection was 9 months ago, repeat right knee injection today. Return in 1 month.

## 2018-08-19 ENCOUNTER — Ambulatory Visit: Payer: Medicare Other | Admitting: Sports Medicine

## 2019-06-25 ENCOUNTER — Ambulatory Visit (INDEPENDENT_AMBULATORY_CARE_PROVIDER_SITE_OTHER): Payer: Medicare Other

## 2019-06-25 ENCOUNTER — Encounter: Payer: Self-pay | Admitting: Sports Medicine

## 2019-06-25 ENCOUNTER — Ambulatory Visit: Payer: Medicare Other | Admitting: Sports Medicine

## 2019-06-25 ENCOUNTER — Other Ambulatory Visit: Payer: Self-pay

## 2019-06-25 DIAGNOSIS — M5030 Other cervical disc degeneration, unspecified cervical region: Secondary | ICD-10-CM | POA: Diagnosis not present

## 2019-06-25 MED ORDER — PREDNISONE 50 MG PO TABS
ORAL_TABLET | ORAL | 0 refills | Status: DC
Start: 2019-06-25 — End: 2019-08-06

## 2019-06-25 MED ORDER — KETOROLAC TROMETHAMINE 30 MG/ML IJ SOLN
30.0000 mg | Freq: Once | INTRAMUSCULAR | Status: AC
  Administered 2019-06-25: 14:00:00 30 mg via INTRAMUSCULAR

## 2019-06-25 MED ORDER — METHYLPREDNISOLONE SODIUM SUCC 125 MG IJ SOLR
125.0000 mg | Freq: Once | INTRAMUSCULAR | Status: AC
  Administered 2019-06-25: 14:00:00 125 mg via INTRAMUSCULAR

## 2019-06-25 MED ORDER — CYCLOBENZAPRINE HCL 10 MG PO TABS: ORAL_TABLET | ORAL | 0 refills | Status: DC

## 2019-06-25 NOTE — Assessment & Plan Note (Signed)
Worst at C5-C6 with significant height loss from an x-ray back in 2015. Toradol 30, Solu-Medrol 125 intramuscular. Prednisone for 5 days, Flexeril at night. Updated x-rays. Rehabilitation exercises given, return to see me in 6 weeks.

## 2019-06-25 NOTE — Progress Notes (Signed)
  Subjective:    CC:  Neck pain  HPI: Randy Ortega is a pleasant 78 year old male with known cervical DDD, he responded well to conservative treatment about 5 years ago.  Now having a recurrence of pain in the neck, bilateral with radiation to bilateral trapezii, worse with turning the neck, and looking upward.  No progressive weakness, no trauma, no constitutional symptoms.  I reviewed the past medical history, family history, social history, surgical history, and allergies today and no changes were needed.  Please see the problem list section below in epic for further details.  Past Medical History: Past Medical History:  Diagnosis Date  . Psoriasis    Past Surgical History: No past surgical history on file. Social History: Social History   Socioeconomic History  . Marital status: Married    Spouse name: Not on file  . Number of children: Not on file  . Years of education: Not on file  . Highest education level: Not on file  Occupational History  . Not on file  Social Needs  . Financial resource strain: Not on file  . Food insecurity    Worry: Not on file    Inability: Not on file  . Transportation needs    Medical: Not on file    Non-medical: Not on file  Tobacco Use  . Smoking status: Never Smoker  . Smokeless tobacco: Never Used  Substance and Sexual Activity  . Alcohol use: Not on file  . Drug use: No  . Sexual activity: Yes    Partners: Female  Lifestyle  . Physical activity    Days per week: Not on file    Minutes per session: Not on file  . Stress: Not on file  Relationships  . Social Herbalist on phone: Not on file    Gets together: Not on file    Attends religious service: Not on file    Active member of club or organization: Not on file    Attends meetings of clubs or organizations: Not on file    Relationship status: Not on file  Other Topics Concern  . Not on file  Social History Narrative  . Not on file   Family History: No family  history on file. Allergies: Allergies  Allergen Reactions  . Choline Fenofibrate Other (See Comments)    Leg cramps  . Metformin And Related Diarrhea  . Niacin Itching   Medications: See med rec.  Review of Systems: No fevers, chills, night sweats, weight loss, chest pain, or shortness of breath.   Objective:    General: Well Developed, well nourished, and in no acute distress.  Neuro: Alert and oriented x3, extra-ocular muscles intact, sensation grossly intact.  HEENT: Normocephalic, atraumatic, pupils equal round reactive to light, neck supple, no masses, no lymphadenopathy, thyroid nonpalpable.  Skin: Warm and dry, no rashes. Cardiac: Regular rate and rhythm, no murmurs rubs or gallops, no lower extremity edema.  Respiratory: Clear to auscultation bilaterally. Not using accessory muscles, speaking in full sentences.  Impression and Recommendations:    DDD (degenerative disc disease), cervical Worst at C5-C6 with significant height loss from an x-ray back in 2015. Toradol 30, Solu-Medrol 125 intramuscular. Prednisone for 5 days, Flexeril at night. Updated x-rays. Rehabilitation exercises given, return to see me in 6 weeks.   ___________________________________________ Gwen Her. Dianah Field, M.D., ABFM., CAQSM. Primary Care and Sports Medicine [facility]  Adjunct Professor of [place] [employer] [facility] [employer]

## 2019-06-30 ENCOUNTER — Encounter: Payer: Self-pay | Admitting: Sports Medicine

## 2019-06-30 DIAGNOSIS — M48061 Spinal stenosis, lumbar region without neurogenic claudication: Secondary | ICD-10-CM

## 2019-06-30 MED ORDER — TRAMADOL HCL 50 MG PO TABS: 100.0000 mg | ORAL_TABLET | Freq: Two times a day (BID) | ORAL | 3 refills | Status: DC

## 2019-08-06 ENCOUNTER — Other Ambulatory Visit: Payer: Self-pay

## 2019-08-06 ENCOUNTER — Ambulatory Visit (INDEPENDENT_AMBULATORY_CARE_PROVIDER_SITE_OTHER): Payer: Medicare Other | Admitting: Sports Medicine

## 2019-08-06 ENCOUNTER — Encounter: Payer: Self-pay | Admitting: Sports Medicine

## 2019-08-06 DIAGNOSIS — M48061 Spinal stenosis, lumbar region without neurogenic claudication: Secondary | ICD-10-CM | POA: Diagnosis not present

## 2019-08-06 DIAGNOSIS — M5030 Other cervical disc degeneration, unspecified cervical region: Secondary | ICD-10-CM

## 2019-08-06 MED ORDER — ACETAMINOPHEN ER 650 MG PO TBCR: 650.0000 mg | EXTENDED_RELEASE_TABLET | Freq: Three times a day (TID) | ORAL | 3 refills | Status: DC | PRN

## 2019-08-06 NOTE — Assessment & Plan Note (Signed)
For the most part resolved after aggressive treatment with Toradol, Solu-Medrol, prednisone, as well as rehabilitation exercises.

## 2019-08-06 NOTE — Assessment & Plan Note (Signed)
Multilevel spinal stenosis, he has had a really good response to axial back pain with selective L3 epidurals, the most recent of which was done in July 2020. Tramadol also gives him fantastic relief of his axial pain. Declined gabapentin, he had some hallucinations with this. He is currently working on a deck, and this is increasing his pain, he will add arthritis strength Tylenol 3 times daily.

## 2019-08-06 NOTE — Progress Notes (Signed)
Subjective:    CC: Follow-up  HPI: Neck pain: Resolved with conservative treatment.  Back pain: L3 radiculitis, he has done well in the past with epidurals, he is currently working on his deck which is increasing his back pain.  He only wants [place] to treat this conservatively.  I reviewed the past medical history, family history, social history, surgical history, and allergies today and no changes were needed.  Please see the problem list section below in epic for further details.  Past Medical History: Past Medical History:  Diagnosis Date  . Psoriasis    Past Surgical History: No past surgical history on file. Social History: Social History   Socioeconomic History  . Marital status: Married    Spouse name: Not on file  . Number of children: Not on file  . Years of education: Not on file  . Highest education level: Not on file  Occupational History  . Not on file  Social Needs  . Financial resource strain: Not on file  . Food insecurity    Worry: Not on file    Inability: Not on file  . Transportation needs    Medical: Not on file    Non-medical: Not on file  Tobacco Use  . Smoking status: Never Smoker  . Smokeless tobacco: Never Used  Substance and Sexual Activity  . Alcohol use: Not on file  . Drug use: No  . Sexual activity: Yes    Partners: Female  Lifestyle  . Physical activity    Days per week: Not on file    Minutes per session: Not on file  . Stress: Not on file  Relationships  . Social Herbalist on phone: Not on file    Gets together: Not on file    Attends religious service: Not on file    Active member of club or organization: Not on file    Attends meetings of clubs or organizations: Not on file    Relationship status: Not on file  Other Topics Concern  . Not on file  Social History Narrative  . Not on file   Family History: No family history on file. Allergies: Allergies  Allergen Reactions  . Choline Fenofibrate Other (See  Comments)    Leg cramps  . Metformin And Related Diarrhea  . Niacin Itching   Medications: See med rec.  Review of Systems: No fevers, chills, night sweats, weight loss, chest pain, or shortness of breath.   Objective:    General: Well Developed, well nourished, and in no acute distress.  Neuro: Alert and oriented x3, extra-ocular muscles intact, sensation grossly intact.  HEENT: Normocephalic, atraumatic, pupils equal round reactive to light, neck supple, no masses, no lymphadenopathy, thyroid nonpalpable.  Skin: Warm and dry, no rashes. Cardiac: Regular rate and rhythm, no murmurs rubs or gallops, no lower extremity edema.  Respiratory: Clear to auscultation bilaterally. Not using accessory muscles, speaking in full sentences.  Impression and Recommendations:    DDD (degenerative disc disease), cervical For the most part resolved after aggressive treatment with Toradol, Solu-Medrol, prednisone, as well as rehabilitation exercises.   Right L3 radiculopathy Multilevel spinal stenosis, he has had a really good response to axial back pain with selective L3 epidurals, the most recent of which was done in July 2020. Tramadol also gives him fantastic relief of his axial pain. Declined gabapentin, he had some hallucinations with this. He is currently working on a deck, and this is increasing his pain, he will add  arthritis strength Tylenol 3 times daily.  I spent 25 minutes with this patient, greater than 50% was face-to-face time counseling regarding the above diagnoses.  ___________________________________________ Ihor Austinhomas J. Benjamin Stainhekkekandam, M.D., ABFM., CAQSM. Primary Care and Sports Medicine [place] [facility]  Adjunct Professor of Family Medicine  [employer]

## 2020-09-23 ENCOUNTER — Encounter: Payer: Self-pay | Admitting: Sports Medicine

## 2020-09-23 ENCOUNTER — Ambulatory Visit: Payer: Medicare Other | Admitting: Sports Medicine

## 2020-09-23 ENCOUNTER — Ambulatory Visit (INDEPENDENT_AMBULATORY_CARE_PROVIDER_SITE_OTHER): Payer: Medicare Other

## 2020-09-23 ENCOUNTER — Other Ambulatory Visit: Payer: Self-pay

## 2020-09-23 DIAGNOSIS — M1611 Unilateral primary osteoarthritis, right hip: Secondary | ICD-10-CM | POA: Diagnosis not present

## 2020-09-23 NOTE — Progress Notes (Signed)
    Procedures performed today:    Procedure: Real-time Ultrasound Guided injection of the right hip joint Device: Samsung HS60  Verbal informed consent obtained.  Time-out conducted.  Noted no overlying erythema, induration, or other signs of local infection.  Skin prepped in a sterile fashion.  Local anesthesia: Topical Ethyl chloride.  With sterile technique and under real time ultrasound guidance:  Noted significantly arthritic hip joint with surrounding synovitis, 1 cc Kenalog 40, 2 cc lidocaine, 2 cc bupivacaine injected easily Completed without difficulty  Pain immediately resolved suggesting accurate placement of the medication.  Advised to call if fevers/chills, erythema, induration, drainage, or persistent bleeding.  Images permanently stored and available for review in PACS.  Impression: Technically successful ultrasound guided injection.  Independent interpretation of notes and tests performed by another provider:   None.  Brief History, Exam, Impression, and Recommendations:    Osteoarthritis of right hip Dorene Sorrow returns, he is a pleasant 79 year old male with recurrence of right hip and groin pain, on exam worse with internal rotation. Tramadol not effective at this point. Last injection was approximately 6 years ago, injected today, return to see me in a month to ensure improvement. Repeating x-rays today.    ___________________________________________ Ihor Austin. Benjamin Stain, M.D., ABFM., CAQSM. Primary Care and Sports Medicine [place] [facility]  Adjunct Instructor of Family Medicine  [employer]

## 2020-09-23 NOTE — Assessment & Plan Note (Signed)
Randy Ortega returns, he is a pleasant 79 year old male with recurrence of right hip and groin pain, on exam worse with internal rotation. Tramadol not effective at this point. Last injection was approximately 6 years ago, injected today, return to see me in a month to ensure improvement. Repeating x-rays today.

## 2020-10-15 MED ORDER — CYCLOBENZAPRINE HCL 10 MG PO TABS: ORAL_TABLET | ORAL | 0 refills | Status: DC

## 2020-10-21 ENCOUNTER — Ambulatory Visit: Payer: Medicare Other | Admitting: Sports Medicine

## 2020-10-21 ENCOUNTER — Ambulatory Visit (INDEPENDENT_AMBULATORY_CARE_PROVIDER_SITE_OTHER): Payer: Medicare Other

## 2020-10-21 ENCOUNTER — Other Ambulatory Visit: Payer: Self-pay

## 2020-10-21 ENCOUNTER — Encounter: Payer: Self-pay | Admitting: Sports Medicine

## 2020-10-21 DIAGNOSIS — M48061 Spinal stenosis, lumbar region without neurogenic claudication: Secondary | ICD-10-CM

## 2020-10-21 DIAGNOSIS — M1611 Unilateral primary osteoarthritis, right hip: Secondary | ICD-10-CM | POA: Diagnosis not present

## 2020-10-21 DIAGNOSIS — M5136 Other intervertebral disc degeneration, lumbar region: Secondary | ICD-10-CM | POA: Diagnosis not present

## 2020-10-21 NOTE — Assessment & Plan Note (Signed)
This is a pleasant 79 year old male, he has a history of a right L2-L4 laminectomy, with some residual right-sided foraminal stenosis at L3-L4 and L4-L5. Historically he has done well with right-sided selective L3-L4 transforaminal epidurals. Tramadol has provided some relief in the past as well. His last epidural was back in 2019, due to a recurrence of pain we are going proceed with an additional right-sided L3-L4 transforaminal epidural with [facility], I would like an updated MRI as his is 79 years old, and he is having worsening pain. At this point I would also like a second opinion from Dr. Yevette Edwards.

## 2020-10-21 NOTE — Progress Notes (Signed)
Left VM for Cathy at [facility] with patient's info for scheduling epidural injection.

## 2020-10-21 NOTE — Progress Notes (Signed)
    Procedures performed today:    None.  Independent interpretation of notes and tests performed by another provider:   MRI personally reviewed, there has been a laminectomy on the right at L2-L4, there is residual right-sided foraminal stenosis at L3-L4 and L4-L5.  Facets look okay/minimally arthritic.  Brief History, Exam, Impression, and Recommendations:    Right L3 radiculopathy This is a pleasant 79 year old male, he has a history of a right L2-L4 laminectomy, with some residual right-sided foraminal stenosis at L3-L4 and L4-L5. Historically he has done well with right-sided selective L3-L4 transforaminal epidurals. Tramadol has provided some relief in the past as well. His last epidural was back in 2019, due to a recurrence of pain we are going proceed with an additional right-sided L3-L4 transforaminal epidural with [facility] imaging, I would like an updated MRI as his is 79 years old, and he is having worsening pain. At this point I would also like a second opinion from Dr. Yevette Edwards.  Osteoarthritis of right hip Doing extremely well at this point after ultrasound-guided injection of the last visit.    ___________________________________________ Ihor Austin. Benjamin Stain, M.D., ABFM., CAQSM. Primary Care and Sports Medicine [place] [facility]  Adjunct Instructor of Family Medicine  [employer]

## 2020-10-21 NOTE — Assessment & Plan Note (Signed)
Doing extremely well at this point after ultrasound-guided injection of the last visit.

## 2020-10-23 ENCOUNTER — Ambulatory Visit (INDEPENDENT_AMBULATORY_CARE_PROVIDER_SITE_OTHER): Payer: Medicare Other

## 2020-10-23 ENCOUNTER — Other Ambulatory Visit: Payer: Self-pay

## 2020-10-23 DIAGNOSIS — M48061 Spinal stenosis, lumbar region without neurogenic claudication: Secondary | ICD-10-CM | POA: Diagnosis not present

## 2020-11-02 ENCOUNTER — Ambulatory Visit
Admission: RE | Admit: 2020-11-02 | Discharge: 2020-11-02 | Disposition: A | Discharge status: Home / Self Care | Payer: Medicare Other | Source: Ambulatory Visit | Attending: Sports Medicine | Admitting: Sports Medicine

## 2020-11-02 ENCOUNTER — Other Ambulatory Visit: Payer: Self-pay

## 2020-11-02 DIAGNOSIS — M48061 Spinal stenosis, lumbar region without neurogenic claudication: Secondary | ICD-10-CM

## 2020-11-02 MED ORDER — METHYLPREDNISOLONE ACETATE 40 MG/ML INJ SUSP (RADIOLOG
120.0000 mg | Freq: Once | INTRAMUSCULAR | Status: AC
  Administered 2020-11-02: 120 mg via EPIDURAL

## 2020-11-02 MED ORDER — IOPAMIDOL (ISOVUE-M 200) INJECTION 41%
1.0000 mL | Freq: Once | INTRAMUSCULAR | Status: AC
  Administered 2020-11-02: 1 mL via EPIDURAL

## 2020-11-02 NOTE — Discharge Instructions (Signed)

## 2020-11-18 ENCOUNTER — Ambulatory Visit: Payer: Medicare Other | Admitting: Sports Medicine

## 2021-09-02 ENCOUNTER — Ambulatory Visit (INDEPENDENT_AMBULATORY_CARE_PROVIDER_SITE_OTHER): Payer: Medicare Other

## 2021-09-02 ENCOUNTER — Ambulatory Visit (INDEPENDENT_AMBULATORY_CARE_PROVIDER_SITE_OTHER): Payer: Medicare Other | Admitting: Sports Medicine

## 2021-09-02 ENCOUNTER — Other Ambulatory Visit: Payer: Self-pay

## 2021-09-02 DIAGNOSIS — M1611 Unilateral primary osteoarthritis, right hip: Secondary | ICD-10-CM | POA: Diagnosis not present

## 2021-09-02 DIAGNOSIS — G8929 Other chronic pain: Secondary | ICD-10-CM | POA: Diagnosis not present

## 2021-09-02 DIAGNOSIS — M25551 Pain in right hip: Secondary | ICD-10-CM | POA: Diagnosis not present

## 2021-09-02 NOTE — Progress Notes (Signed)
    Procedures performed today:    Procedure: Real-time Ultrasound Guided injection of the right hip joint Device: Samsung HS60  Verbal informed consent obtained.  Time-out conducted.  Noted no overlying erythema, induration, or other signs of local infection.  Skin prepped in a sterile fashion.  Local anesthesia: Topical Ethyl chloride.  With sterile technique and under real time ultrasound guidance: Noted arthritic joint, 1 cc Kenalog 40, 2 cc lidocaine, 2 cc bupivacaine injected easily Completed without difficulty  Advised to call if fevers/chills, erythema, induration, drainage, or persistent bleeding.  Images permanently stored and available for review in PACS.  Impression: Technically successful ultrasound guided injection.  Independent interpretation of notes and tests performed by another provider:   None.  Brief History, Exam, Impression, and Recommendations:    Osteoarthritis of right hip Randy Ortega returns, he has known hip osteoarthritis, we last did a hip joint injection in October of last year, now with recurrence of pain. Repeat right hip joint injection today, referral to Dr. Luiz Blare for discussion of arthroplasty. Getting updated x-rays today.    ___________________________________________ Randy Ortega. Randy Ortega, M.D., ABFM., CAQSM. Primary Care and Sports Medicine [place] [facility]  Adjunct Instructor of Family Medicine  [employer]

## 2021-09-02 NOTE — Assessment & Plan Note (Signed)
Charleton returns, he has known hip osteoarthritis, we last did a hip joint injection in October of last year, now with recurrence of pain. Repeat right hip joint injection today, referral to Dr. Luiz Blare for discussion of arthroplasty. Getting updated x-rays today.

## 2022-09-06 ENCOUNTER — Encounter: Payer: Self-pay | Admitting: Sports Medicine

## 2022-09-06 ENCOUNTER — Ambulatory Visit (INDEPENDENT_AMBULATORY_CARE_PROVIDER_SITE_OTHER): Payer: Medicare Other | Admitting: Sports Medicine

## 2022-09-06 DIAGNOSIS — M79605 Pain in left leg: Secondary | ICD-10-CM | POA: Diagnosis not present

## 2022-09-06 MED ORDER — PREDNISONE 50 MG PO TABS: ORAL_TABLET | ORAL | 0 refills | Status: DC

## 2022-09-06 NOTE — Progress Notes (Signed)
    Procedures performed today:    Left lower extremity strapped with a compressive dressing.  Independent interpretation of notes and tests performed by another provider:   None.  Brief History, Exam, Impression, and Recommendations:    Left leg pain Pleasant 81 year old male, no trauma but has had increasing pain left calf, lateral musculotendinous junction gastrocnemius, tenderness to palpation, he does have a positive Homans' sign. Suspect calf strain, we will add heel lifts, Conditioning exercises, abstractions lower extremity. I would like a lower extremity DVT ultrasound. Return to see me about 4 weeks.    ____________________________________________ Gwen Her. Dianah Field, M.D., ABFM., CAQSM., AME. Primary Care and Sports Medicine [place] [facility]  Adjunct Professor of [place] [employer] [facility] [employer] Medicine  Risk manager

## 2022-09-06 NOTE — Assessment & Plan Note (Signed)
Pleasant 81 year old male, no trauma but has had increasing pain left calf, lateral musculotendinous junction gastrocnemius, tenderness to palpation, he does have a positive Homans' sign. Suspect calf strain, we will add heel lifts, Conditioning exercises, abstractions lower extremity. I would like a lower extremity DVT ultrasound. Return to see me about 4 weeks.

## 2022-09-07 ENCOUNTER — Ambulatory Visit (INDEPENDENT_AMBULATORY_CARE_PROVIDER_SITE_OTHER): Payer: Medicare Other

## 2022-09-07 DIAGNOSIS — M79605 Pain in left leg: Secondary | ICD-10-CM | POA: Diagnosis not present

## 2022-09-07 DIAGNOSIS — M7989 Other specified soft tissue disorders: Secondary | ICD-10-CM

## 2022-10-04 ENCOUNTER — Encounter: Payer: Self-pay | Admitting: Sports Medicine

## 2022-10-04 ENCOUNTER — Ambulatory Visit (INDEPENDENT_AMBULATORY_CARE_PROVIDER_SITE_OTHER): Payer: Medicare Other | Admitting: Sports Medicine

## 2022-10-04 DIAGNOSIS — M48061 Spinal stenosis, lumbar region without neurogenic claudication: Secondary | ICD-10-CM | POA: Diagnosis not present

## 2022-10-04 MED ORDER — CELECOXIB 200 MG PO CAPS: 200.0000 mg | ORAL_CAPSULE | Freq: Two times a day (BID) | ORAL | 3 refills | Status: AC | PRN

## 2022-10-04 NOTE — Addendum Note (Signed)
Addended by: Tarri Glenn A on: 10/04/2022 10:23 AM   Modules accepted: Orders

## 2022-10-04 NOTE — Assessment & Plan Note (Signed)
Randy Ortega returns, he is a very pleasant 81 year old male, he has chronic left calf pain with radiation to the anterior shin and over the dorsum of the left foot. He is post right L3 laminectomy. Last MRI was in 2021. We initially treated him as this was a calf strain, we also had lower extremity Dopplers that were negative. He also had a burst of prednisone that did not help. Unfortunately after several weeks of treating this is a calf strain with heel lifts, home conditioning and calf compression wrap he has not improved at all. My suspicion is now that this may be coming from his lumbar spine, I would like an updated lumbar spine MRI, refilling Celebrex, we are looking for a left L5 versus S1 nerve root compression and we would then proceed with an epidural. For insurance purposes he has failed greater than 6 weeks of physician directed conservative treatment, steroids, analgesics and x-rays not helpful.

## 2022-10-04 NOTE — Progress Notes (Signed)
    Procedures performed today:    None.  Independent interpretation of notes and tests performed by another provider:   None.  Brief History, Exam, Impression, and Recommendations:    Lumbar spinal stenosis Randy Ortega returns, he is a very pleasant 81 year old male, he has chronic left calf pain with radiation to the anterior shin and over the dorsum of the left foot. He is post right L3 laminectomy. Last MRI was in 2021. We initially treated him as this was a calf strain, we also had lower extremity Dopplers that were negative. He also had a burst of prednisone that did not help. Unfortunately after several weeks of treating this is a calf strain with heel lifts, home conditioning and calf compression wrap he has not improved at all. My suspicion is now that this may be coming from his lumbar spine, I would like an updated lumbar spine MRI, refilling Celebrex, we are looking for a left L5 versus S1 nerve root compression and we would then proceed with an epidural. For insurance purposes he has failed greater than 6 weeks of physician directed conservative treatment, steroids, analgesics and x-rays not helpful.  Chronic process with exacerbation and pharmacologic intervention  ____________________________________________ Gwen Her. Dianah Field, M.D., ABFM., CAQSM., AME. Primary Care and Sports Medicine [place] [facility]  Adjunct Professor of [place] [employer] [facility] of Medicine  Risk manager

## 2022-10-21 ENCOUNTER — Ambulatory Visit (INDEPENDENT_AMBULATORY_CARE_PROVIDER_SITE_OTHER): Payer: Medicare Other

## 2022-10-21 DIAGNOSIS — M48061 Spinal stenosis, lumbar region without neurogenic claudication: Secondary | ICD-10-CM

## 2022-10-24 ENCOUNTER — Encounter: Payer: Self-pay | Admitting: Sports Medicine

## 2022-10-24 DIAGNOSIS — M48061 Spinal stenosis, lumbar region without neurogenic claudication: Secondary | ICD-10-CM

## 2023-08-13 IMAGING — DX DG HIP (WITH OR WITHOUT PELVIS) 2-3V*R*
3 series · 3 of 3 positions shown · non-contrast
Comparison: 09/23/2020

CLINICAL DATA: Right hip pain a few months.  No injury.

EXAM:
DG HIP (WITH OR WITHOUT PELVIS) 2-3V RIGHT

[pelvis ap]
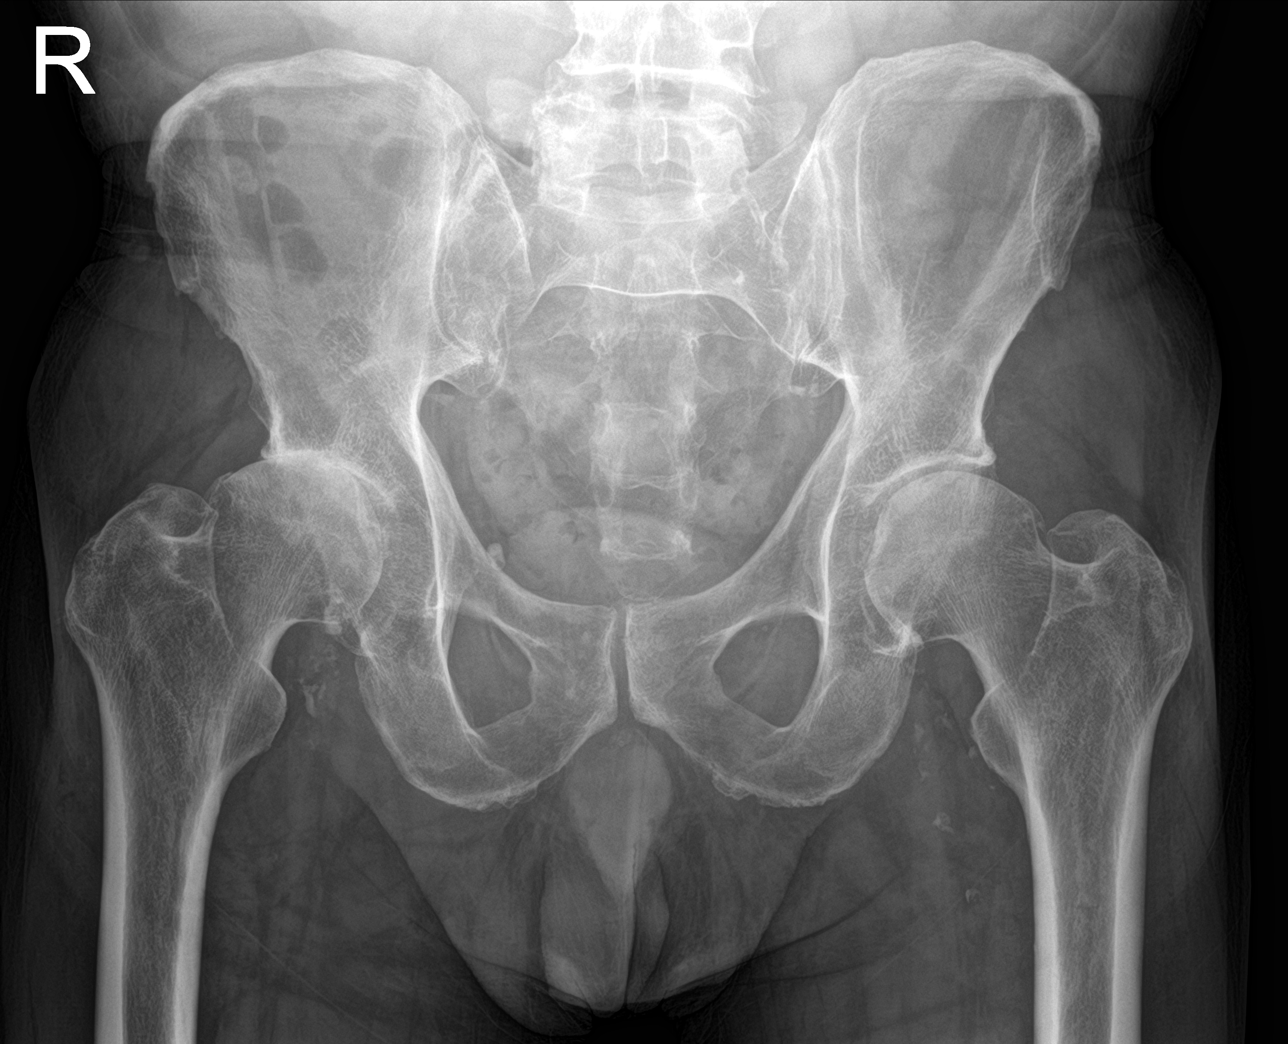

[hip ap]
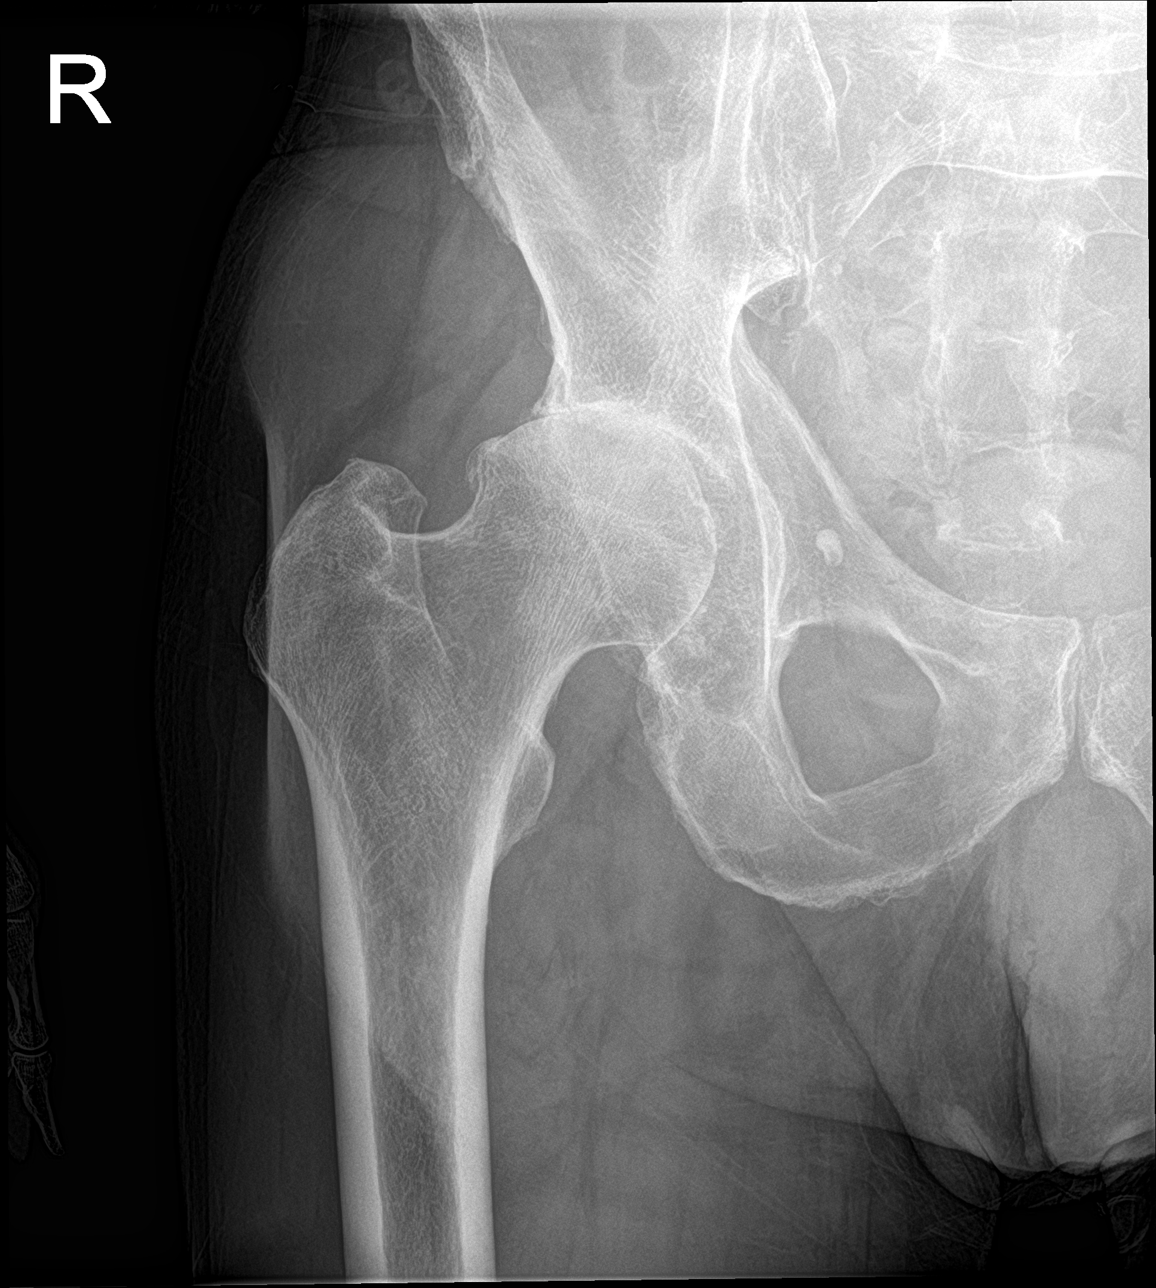

[hip lat]
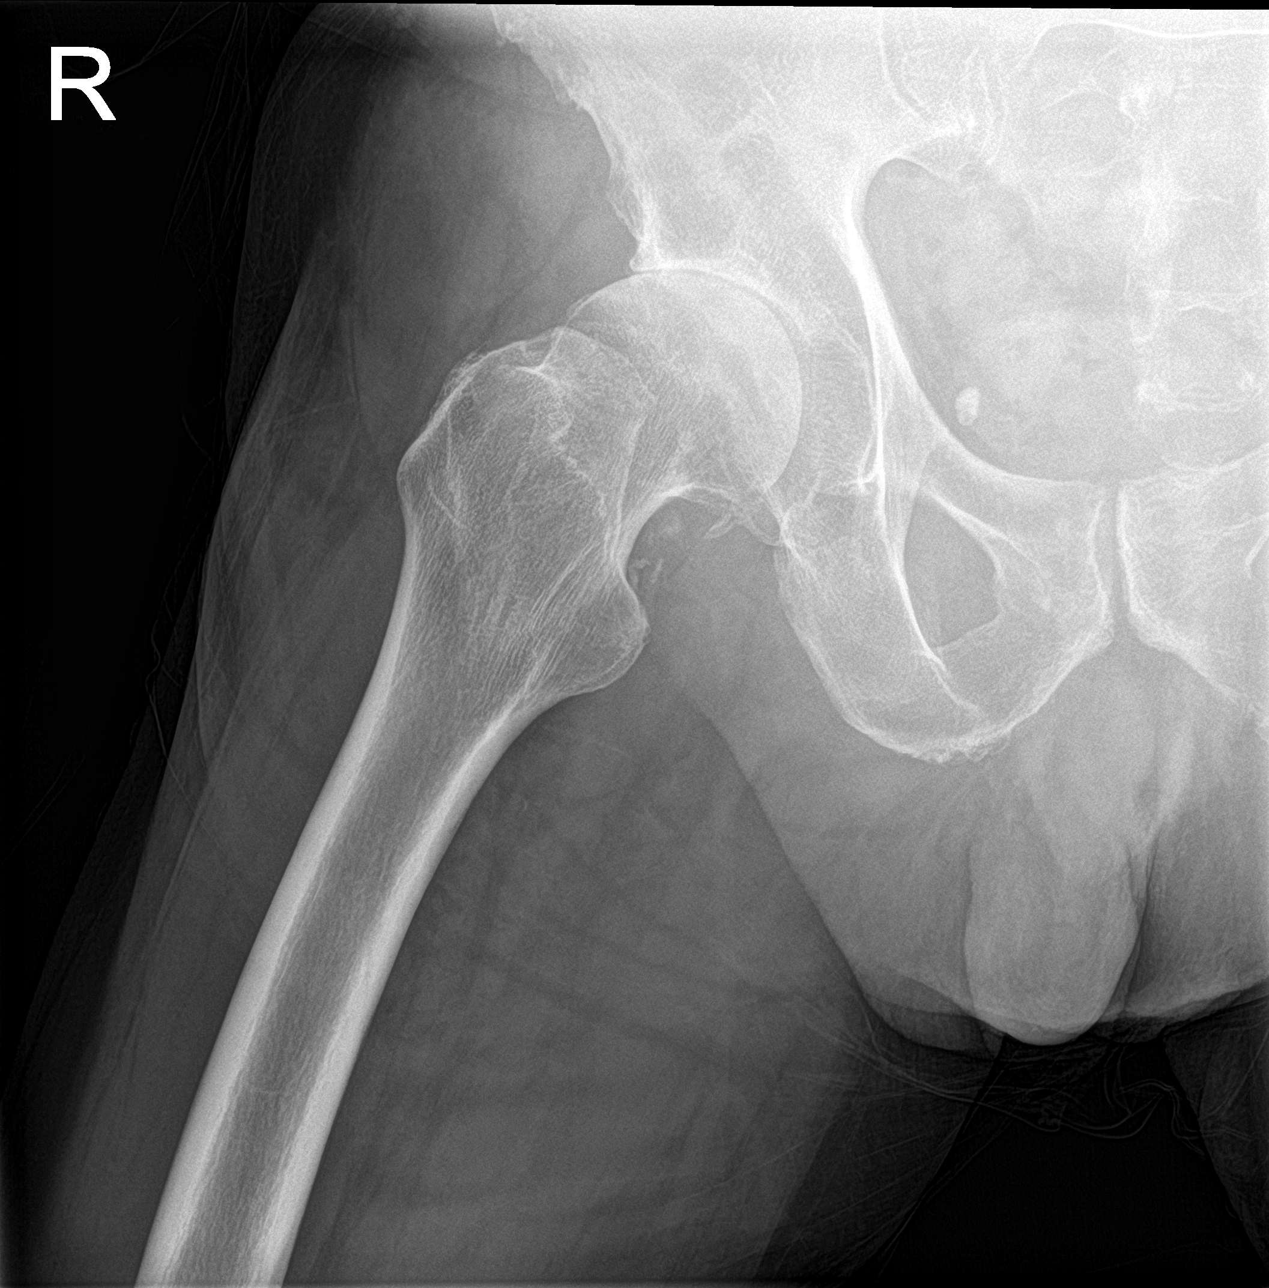

[3 of 3 positions shown; findings below may reference images not displayed]

FINDINGS: There is mild osteoarthritis of the left hip unchanged. There is
moderate osteoarthritis of the right hip with interval progression
demonstrating obliteration of the superolateral joint space. No
acute fracture or dislocation.
IMPRESSION: Moderate osteoarthritis of the right hip with interval progression
demonstrating obliteration of the superolateral joint space. Mild
osteoarthritis of the left hip unchanged.

## 2024-08-05 ENCOUNTER — Encounter: Payer: Self-pay | Admitting: Sports Medicine
